# Patient Record
Sex: Female | Born: 1970
Health system: Southern US, Community
[De-identification: ages and names within clinical notes are randomized; demographics above are authoritative.]

## PROBLEM LIST (undated history)

## (undated) DIAGNOSIS — K589 Irritable bowel syndrome without diarrhea: Secondary | ICD-10-CM

## (undated) DIAGNOSIS — F419 Anxiety disorder, unspecified: Secondary | ICD-10-CM

## (undated) DIAGNOSIS — R51 Headache: Secondary | ICD-10-CM

## (undated) HISTORY — PX: ABLATION: SHX5711

## (undated) HISTORY — PX: CHOLECYSTECTOMY: SHX55

## (undated) HISTORY — PX: TUBAL LIGATION: SHX77

---

## 2004-01-28 ENCOUNTER — Ambulatory Visit (HOSPITAL_COMMUNITY): Admission: RE | Admit: 2004-01-28 | Discharge: 2004-01-28 | Payer: Self-pay | Admitting: Family Medicine

## 2006-11-03 ENCOUNTER — Ambulatory Visit: Payer: Self-pay | Admitting: Internal Medicine

## 2007-03-31 ENCOUNTER — Encounter (INDEPENDENT_AMBULATORY_CARE_PROVIDER_SITE_OTHER): Payer: Self-pay | Admitting: Obstetrics and Gynecology

## 2007-03-31 ENCOUNTER — Ambulatory Visit (HOSPITAL_COMMUNITY): Admission: RE | Admit: 2007-03-31 | Discharge: 2007-03-31 | Payer: Self-pay | Admitting: Obstetrics and Gynecology

## 2007-09-04 ENCOUNTER — Emergency Department (HOSPITAL_COMMUNITY): Admission: EM | Admit: 2007-09-04 | Discharge: 2007-09-04 | Payer: Self-pay | Admitting: Family Medicine

## 2007-12-02 ENCOUNTER — Emergency Department (HOSPITAL_COMMUNITY): Admission: EM | Admit: 2007-12-02 | Discharge: 2007-12-02 | Payer: Self-pay | Admitting: Emergency Medicine

## 2008-04-01 ENCOUNTER — Emergency Department (HOSPITAL_COMMUNITY): Admission: EM | Admit: 2008-04-01 | Discharge: 2008-04-01 | Payer: Self-pay | Admitting: Emergency Medicine

## 2008-07-16 ENCOUNTER — Ambulatory Visit (HOSPITAL_COMMUNITY): Admission: RE | Admit: 2008-07-16 | Discharge: 2008-07-16 | Payer: Self-pay | Admitting: Pediatrics

## 2008-12-19 ENCOUNTER — Emergency Department (HOSPITAL_COMMUNITY): Admission: EM | Admit: 2008-12-19 | Discharge: 2008-12-19 | Payer: Self-pay | Admitting: Family Medicine

## 2009-11-24 ENCOUNTER — Ambulatory Visit: Payer: Self-pay | Admitting: Internal Medicine

## 2009-12-08 ENCOUNTER — Ambulatory Visit (HOSPITAL_COMMUNITY): Admission: RE | Admit: 2009-12-08 | Discharge: 2009-12-08 | Payer: Self-pay | Admitting: Internal Medicine

## 2009-12-08 ENCOUNTER — Ambulatory Visit: Payer: Self-pay | Admitting: Internal Medicine

## 2010-01-13 ENCOUNTER — Ambulatory Visit: Payer: Self-pay | Admitting: Internal Medicine

## 2010-03-11 ENCOUNTER — Other Ambulatory Visit: Payer: Self-pay | Admitting: Obstetrics and Gynecology

## 2010-03-11 DIAGNOSIS — N6459 Other signs and symptoms in breast: Secondary | ICD-10-CM

## 2010-03-16 ENCOUNTER — Ambulatory Visit
Admission: RE | Admit: 2010-03-16 | Discharge: 2010-03-16 | Disposition: A | Discharge status: Home / Self Care | Payer: Commercial Managed Care - PPO | Source: Ambulatory Visit | Attending: Obstetrics and Gynecology | Admitting: Obstetrics and Gynecology

## 2010-03-16 ENCOUNTER — Other Ambulatory Visit: Payer: Self-pay | Admitting: Obstetrics and Gynecology

## 2010-03-16 DIAGNOSIS — N6459 Other signs and symptoms in breast: Secondary | ICD-10-CM

## 2010-04-30 LAB — POCT CARDIAC MARKERS
Myoglobin, poc: 51.1 ng/mL (ref 12–200)
Troponin i, poc: <0.05 ng/mL (ref 0.00–0.09)

## 2010-04-30 LAB — BASIC METABOLIC PANEL
BUN: 14 mg/dL (ref 6–23)
Calcium: 9.4 mg/dL (ref 8.4–10.5)
GFR calc non Af Amer: >60 mL/min (ref >60)
Glucose, Bld: 96 mg/dL (ref 70–99)
Potassium: 3.6 mEq/L (ref 3.5–5.1)

## 2010-04-30 LAB — DIFFERENTIAL
Basophils Absolute: 0.1 10*3/uL (ref 0.0–0.1)
Eosinophils Relative: 2 % (ref 0–5)
Lymphocytes Relative: 42 % (ref 12–46)

## 2010-04-30 LAB — CBC
HCT: 42.5 % (ref 36.0–46.0)
Platelets: 242 10*3/uL (ref 150–400)
RDW: 12.2 % (ref 11.5–15.5)

## 2010-04-30 LAB — D-DIMER, QUANTITATIVE: D-Dimer, Quant: 0.22 ug/mL-FEU (ref 0.00–0.48)

## 2010-06-02 NOTE — Assessment & Plan Note (Signed)
NAMEMarland Kitchen  Kathleen Price, Kathleen Price                 CHART#:  [id number]   DATE:                                   DOB:  1970/07/20   REQUESTING PHYSICIAN:  Dr. Milinda Cave.   REASON FOR CONSULTATION:  Chronic constipation.   HISTORY OF PRESENT ILLNESS:  The patient is a 40 year old Caucasian  female.  She has a longstanding history of constipation for which she  has had IBS for many years.  She tells me that generally her IBS  alternates between constipation and diarrhea.  However, over the last 3  months, she has had severe constipation.  She is having to take 2  Senokot S every night at bedtime for the last month.  She has also been  tried on Amitiza 8 mcg b.i.d. for a month which did not seem to help.  She complains of abdominal bloating, as well as cramp-like abdominal  pain which is usually relieved post defecation.  She denies any rectal  bleeding or melena.  She can go up to a week without a bowel movement.  She has had problems with her stools since age 47.  Her IBS presented as  diarrhea initially.  She does have some nausea, especially in the  evenings, had 1 episode of vomiting, but no problems with chronic  vomiting.  Denies any heartburn or indigestion.  Denies any dysphagia or  odynophagia.  Denies any anorexia.  She returned 3 Hemoccult cards  through Dr. Samul Dada office which were all negative.  Her weight is up  about 7 pounds in the last 3 months.  She is complaining of some night  sweats.   PAST MEDICAL/SURGICAL HISTORY:  She is status post cholecystectomy in  2005 secondary to cholelithiasis.  She is status post tubal ligation in  2001.  She has had 2 C-sections.   CURRENT MEDICATIONS:  1. Senokot S p.r.n.  2. Wellbutrin 150 mg b.i.d.   ALLERGIES:  NO KNOWN DRUG ALLERGIES.   FAMILY HISTORY:  Positive for maternal grandfather with colon cancer  diagnosed in his 36s.  Mother, age 36, has hyperlipidemia.  Father, age  3, is healthy.  She has 2 healthy brothers.   SOCIAL  HISTORY:  The patient is single.  She lives with her fiance' and  is to be married in May of 2009.  She has 3 healthy children.  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  She has a 20-pack year  history of tobacco use.  She consumes 1 to 2 alcoholic beverages a year.  She denies any drug use.   REVIEW OF SYSTEMS:  See HPI.  GYN:  Last menstrual period 09/19/06.  She  admits to being late but is status post tubal ligation in 2001.  Typically, she has normal 30-day cycles.   PHYSICAL EXAM:  VITAL SIGNS:  Weight 129 pounds.  Height 63 inches.  Temp 98.1.  Blood pressure 100/80.  Pulse 64.  GENERAL:  The patient is a 40 year old Caucasian female who is alert,  oriented, pleasant, cooperative, and in no acute distress.  HEENT:  Pupils equal.  Sclerae clear.  Nonicteric.  Oropharynx pink and  moist withoutlesions.  NECK:  Supple without any masses or thyromegaly.  She does have a single  right cervical chain node  which is freely mobile, slightly tender, and  approximately 1 x 1.5 cm, palpable.  CHEST:  Heart, regular rate and rhythm.  Normal S1 and S2 without  murmurs, clicks, rubs, or gallops.  LUNGS:  Clear to auscultation bilaterally.  ABDOMEN:  Positive bowel sounds x4.  No bruits auscultated.  Soft,  nontender, and nondistended.  She does have umbilical ornament.  There  is no rebound, tenderness, or guarding.  No hepatosplenomegaly or mass.  EXTREMITIES:  Without any clubbing or edema bilaterally.  SKIN:  Pink, warm, and dry without any rash or jaundice.  RECTAL:  No external lesionsvisualized.  Good sphincter tone.  No  internal masses palpated.  A small amount of light brown stool was  obtained from the vault which Hemoccult negative.   LABORATORY STUDIES:  From 09/21/06, show a normal CBC, a normal sed  rate, and normal comprehensive metabolic panel.   IMPRESSION:  The patient is a 40 year old Caucasian female with history  significant for IBS which generally  alternates between diarrhea and  constipation, however, over the last 3 months she has had significant  constipation that has not responded to low-dose Amitiza.  She has also  had weight gain and I feel it would be appropriate to rule out  hypothyroidism in this lady.  I doubt we are dealing with pelvic floor  dyssynergy given the fact that constipation is a new problem for the  patient.   PLAN:  1. Increase Amitiza to 24 mcg 1 p.o. daily or b.i.d. as needed for      constipation, #31, with 1 refill and I have given her 2 boxes of      samples.  2. Check TSH.  3. MiraLax 17 g daily if needed for constipation.  4. Begin fiber supplement of choice.  I have given her Benefiber      samples.  5. Constipation literature given for her review.  6. Office visit in 6 weeks with Dr. Jena Gauss to assess her progress.   I would like to thank Dr. Milinda Cave for allowing [place] to participate in the  care of the patient.       Lorenza Burton, N.P.  Electronically Signed     R. Roetta Sessions, M.D.  Electronically Signed    KJ/MEDQ  D:  11/03/2006  T:  11/04/2006  Job:  [id number]   cc:   Jeoffrey Massed, MD

## 2010-06-02 NOTE — Op Note (Signed)
Kathleen Price, Kathleen Price                ACCOUNT NO.:  [id number]   MEDICAL RECORD NO.:  [id number]          PATIENT TYPE:  AMB   LOCATION:  SDC                           FACILITY:  [facility]   PHYSICIAN:  Miguel Aschoff, M.D.       DATE OF BIRTH:  [birth year]   DATE OF PROCEDURE:  03/31/2007  DATE OF DISCHARGE:                               OPERATIVE REPORT   PREOPERATIVE DIAGNOSIS:  Menorrhagia.   POSTOPERATIVE DIAGNOSIS:  Menorrhagia.   PROCEDURE:  Cervical dilatation hysteroscopy, uterine curettage,  NovaSure endometrial ablation.   SURGEON:  Dr. Miguel Aschoff.   ANESTHESIA:  General.   COMPLICATIONS:  None.   JUSTIFICATION:  The patient is a 40 year old white female with history  of very heavy menses, getting  progressively worse.  She now requests a  procedure to be done in an effort to control her menstrual flow.  The  risks and benefits of endometrial ablation were discussed with the  patient, and informed consent has been obtained.   PROCEDURE:  The patient was taken to the operating room, placed in the  supine position, and general anesthesia was administered without  difficulty.  She was then placed in the dorsal lithotomy position,  prepped and draped in usual sterile fashion.  The bladder was  catheterized.  After this was done, examination under anesthesia was  carried out which revealed normal external genitalia, normal Bartholin  and Skene's glands, normal urethra.  The vaginal vault was without gross  lesion.  The cervix was without gross lesion.  The uterus was noted to  be mid position, top normal size.  There were no adnexal masses noted.  At this point, speculum was placed in the vaginal vault.  Anterior  cervical lip was grasped with a tenaculum, and then the endometrial  cavity was sounded to 10 cm.  Cervical length of 4 cm was then  determined.  At this point, serial Pratt dilators were used to dilate  the cervix until a #25 Pratt dilator could be passed.  At this  point,  the diagnostic hysteroscope was advanced through the endocervix.  No  endocervical lesions were noted.  The endometrial cavity was inspected.  There were no submucous myomas or polyps noted.  Cavity appeared smooth  and regular.  The scope was then removed.  A sharp curettage was then  carried out using a medium-size serrated curette, and then at completion  of the curettage, the NovaSure endometrial ablation unit was placed into  the uterus.  Cavity width was then determined of 3.7 cm, and with 122  watts of power, a 1-minute and 20-second treatment cycle was carried out  without difficulty.  At completion of the ablation, the NovaSure unit  was removed intact and inspected, and then the scope was replaced into  the uterine cavity and revealed good coagulation of the endometrial  surface.  At this point, the procedure was completed.  The cervix was  injected with 1% Xylocaine for analgesia.  All instruments were removed.  Excellent hemostasis was obtained.  Lap counts and instrument counts  were found to  be correct, and the procedure was completed.  The patient  was reversed from the anesthetic and brought to the recovery room.   PLAN:  Is for the patient be discharged home.  Medications for home  include doxycycline 100 mg twice a day for 3 days and Darvocet-N 100 one  every hours as needed for pain.  The patient will be seen back in 4  weeks for follow-up examination.  She is to call for any problems such  as fever, pain, or heavy bleeding.      Miguel Aschoff, M.D.  Electronically Signed     AR/MEDQ  D:  03/31/2007  T:  04/01/2007  Job:  [id number]

## 2010-10-12 LAB — CBC
HCT: 41.1
Hemoglobin: 14.6
MCHC: 35.6
MCV: 89.8
Platelets: 245
RDW: 12.4

## 2011-02-28 ENCOUNTER — Emergency Department (HOSPITAL_COMMUNITY): Payer: 59

## 2011-02-28 ENCOUNTER — Emergency Department (HOSPITAL_COMMUNITY)
Admission: EM | Admit: 2011-02-28 | Discharge: 2011-02-28 | Disposition: A | Discharge status: Home / Self Care | Payer: 59 | Attending: Emergency Medicine | Admitting: Emergency Medicine

## 2011-02-28 ENCOUNTER — Encounter (HOSPITAL_COMMUNITY): Payer: Self-pay

## 2011-02-28 DIAGNOSIS — M2669 Other specified disorders of temporomandibular joint: Secondary | ICD-10-CM | POA: Insufficient documentation

## 2011-02-28 DIAGNOSIS — R209 Unspecified disturbances of skin sensation: Secondary | ICD-10-CM | POA: Insufficient documentation

## 2011-02-28 DIAGNOSIS — F17200 Nicotine dependence, unspecified, uncomplicated: Secondary | ICD-10-CM | POA: Insufficient documentation

## 2011-02-28 DIAGNOSIS — R51 Headache: Secondary | ICD-10-CM | POA: Insufficient documentation

## 2011-02-28 HISTORY — DX: Headache: R51

## 2011-02-28 LAB — DIFFERENTIAL
Basophils Absolute: 0 10*3/uL (ref 0.0–0.1)
Basophils Relative: 0 % (ref 0–1)
Eosinophils Absolute: 0.2 10*3/uL (ref 0.0–0.7)
Eosinophils Relative: 2 % (ref 0–5)
Lymphs Abs: 1.9 10*3/uL (ref 0.7–4.0)
Neutrophils Relative %: 72 % (ref 43–77)

## 2011-02-28 LAB — CBC
MCH: 31.3 pg (ref 26.0–34.0)
MCHC: 34.9 g/dL (ref 30.0–36.0)
MCV: 89.7 fL (ref 78.0–100.0)
Platelets: 229 10*3/uL (ref 150–400)
RDW: 13.2 % (ref 11.5–15.5)

## 2011-02-28 LAB — BASIC METABOLIC PANEL
Calcium: 9.2 mg/dL (ref 8.4–10.5)
GFR calc Af Amer: >90 mL/min (ref >90)
GFR calc non Af Amer: >90 mL/min (ref >90)
Glucose, Bld: 148 mg/dL — ABNORMAL HIGH (ref 70–99)
Sodium: 139 mEq/L (ref 135–145)

## 2011-02-28 MED ORDER — FROVATRIPTAN SUCCINATE 2.5 MG PO TABS: 2.5000 mg | ORAL_TABLET | ORAL | Status: DC | PRN

## 2011-02-28 MED ORDER — TRAMADOL HCL 50 MG PO TABS
50.0000 mg | ORAL_TABLET | Freq: Once | ORAL | Status: AC
  Administered 2011-02-28: 50 mg via ORAL
  Filled 2011-02-28: qty 1

## 2011-02-28 MED ORDER — METOCLOPRAMIDE HCL 10 MG PO TABS
10.0000 mg | ORAL_TABLET | Freq: Once | ORAL | Status: AC
  Administered 2011-02-28: 10 mg via ORAL
  Filled 2011-02-28: qty 1

## 2011-02-28 MED ORDER — SUMATRIPTAN SUCCINATE 100 MG PO TABS: 100.0000 mg | ORAL_TABLET | ORAL | Status: DC | PRN

## 2011-02-28 NOTE — ED Notes (Signed)
Pt c/o headache for 2 days, right side facial numbness that started today, right eye pain and right ear pain.   H/o headache and facial umbness.  Denies any n/v or fever.  No dizziness.

## 2011-02-28 NOTE — ED Provider Notes (Signed)
History  This chart was scribed for Kathleen Racer, MD by Bennett Scrape. This patient was seen in room APA05/APA05 and the patient's care was started at 5:12PM.  CSN: [id number]  Arrival date & time 02/28/11  1417   First MD Initiated Contact with Patient 02/28/11 1649      Chief Complaint  Patient presents with  . Headache  . Numbness    Patient is a 41 y.o. female presenting with headaches. The history is provided by the patient. No language interpreter was used.  Headache  This is a new problem. The current episode started yesterday. The problem occurs constantly. The problem has not changed since onset.The headache is associated with nothing. The pain is located in the parietal region. The quality of the pain is described as dull. The pain does not radiate. Pertinent negatives include no fever, no shortness of breath, no nausea and no vomiting.    Kathleen Price is a 41 y.o. female who presents to the Emergency Department complaining of less than 24 hours of a gradual onset, non-changing, constant HA located along the left parietal described as dull with associated left sided facial numbness around the left eye, vision impairment in her left eye described as a "bouncing: sensation and intermittent ringing in the left ear that started a couple of hours before the pt went to bed last night. She also c/o increased episodes of pain described as a pins and needles feeling in her left arm since th onset of the HA. She reports taking Motrin with no improvement in symptoms. She denies any modifying factors. She reports previous episodes of milder facial numbness and HA that resolved on their own. She states that she has seen her doctor for the symptoms and was diagnosed with cluster HAs. She denies visual or hearing changes, dizziness, congestion, nausea, vomiting and diarrhea as associated symptoms. She has no h/o other chronic medical conditions. She is a current everyday smoker but denies  alcohol use.  Past Medical History  Diagnosis Date  . Headache     Past Surgical History  Procedure Date  . Cesarean section   . Cholecystectomy   . Tubal ligation     No family history on file.  History  Substance Use Topics  . Smoking status: Current Everyday Smoker -- 0.5 packs/day  . Smokeless tobacco: Not on file  . Alcohol Use: No     Review of Systems  Constitutional: Negative for fever and chills.  HENT: Negative for congestion, sore throat, rhinorrhea and neck pain.   Eyes: Negative for pain.  Respiratory: Negative for cough and shortness of breath.   Cardiovascular: Negative for chest pain.  Gastrointestinal: Negative for nausea, vomiting, abdominal pain and diarrhea.  Genitourinary: Negative for dysuria, frequency, hematuria and flank pain.  Musculoskeletal: Negative for back pain.  Skin: Negative for rash.  Neurological: Positive for numbness and headaches. Negative for dizziness and light-headedness.    Allergies  Wellbutrin  Home Medications   Current Outpatient Rx  Name Route Sig Dispense Refill  . SUMATRIPTAN SUCCINATE 100 MG PO TABS Oral Take 1 tablet (100 mg total) by mouth every 2 (two) hours as needed for migraine. 10 tablet 0    Triage Vitals: BP 112/58  Pulse 78  Temp(Src) 97.4 F (36.3 C) (Oral)  Resp 18  Ht 5\' 3"  (1.6 m)  Wt 125 lb (56.7 kg)  BMI 22.14 kg/m2  SpO2 100%  Physical Exam  Nursing note and vitals reviewed. Constitutional: She is oriented to person,  place, and time. She appears well-developed and well-nourished. No distress.  HENT:  Head: Normocephalic and atraumatic.  Mouth/Throat: Oropharynx is clear and moist.       Effusion of the left TM, no erythema   Eyes: EOM are normal. Pupils are equal, round, and reactive to light.       No nystagmus   Neck: Normal range of motion. Neck supple. No tracheal deviation present.       No meningeal signs  Cardiovascular: Normal rate, regular rhythm and normal heart sounds.     Pulmonary/Chest: Effort normal and breath sounds normal. No stridor. No respiratory distress. She has no wheezes. She has no rales. She exhibits no tenderness.  Abdominal: Soft. Bowel sounds are normal. She exhibits no distension. There is no tenderness. There is no rebound and no guarding.  Musculoskeletal: Normal range of motion. She exhibits no edema and no tenderness.  Neurological: She is alert and oriented to person, place, and time. No cranial nerve deficit. Coordination normal.       5 out of 5 motor strength in all extremities, mild sensation decrease around the left eye  Skin: Skin is warm and dry. No rash noted.  Psychiatric: She has a normal mood and affect. Her behavior is normal.    ED Course  Procedures (including critical care time)  DIAGNOSTIC STUDIES: Oxygen Saturation is 100% on room air, normal by my interpretation.    COORDINATION OF CARE: 5:19Pm-Discussed CT scan of head and treatment plan with pt at bedside and pt agreed. I will contact neurologist to discuss possible appointment for pt. Consult-Call started at 8:09PM. Discussed pt's symptoms with Dr. Cleophus Molt, neurologist. Call ended at 8:12PM.  Labs Reviewed  BASIC METABOLIC PANEL - Abnormal; Notable for the following:    Potassium 3.4 (*)    Glucose, Bld 148 (*)    All other components within normal limits  CBC  DIFFERENTIAL   Ct Head Wo Contrast  02/28/2011  *RADIOLOGY REPORT*  Clinical Data: Left-sided headache  CT HEAD WITHOUT CONTRAST  Technique:  Contiguous axial images were obtained from the base of the skull through the vertex without contrast.  Comparison: None.  Findings: No skull fracture is noted.  Paranasal sinuses and mastoid air cells are unremarkable.  No intracranial hemorrhage, mass effect or midline shift.  No hydrocephalus.  The gray and white matter differentiation is preserved.  No intra or extra-axial fluid collection.  There is no acute infarction.  No mass lesion is noted on this unenhanced  scan.  IMPRESSION: No acute intracranial abnormality.  Original Report Authenticated By: Natasha Mead, M.D.     1. HA (headache)       MDM  Dr Cleophus Molt eval'd pt. OK to d/c home and f/u. Suggest imetrex for pt symptoms. HA likely cluster vs migraine. Pt currently asymptomatic. VSS      I personally performed the services described in this documentation, which was scribed in my presence. The recorded information has been reviewed and considered.        Kathleen Racer, MD 02/28/11 2109

## 2011-02-28 NOTE — ED Notes (Signed)
Pt presents with headache and facial numbness since last night. Pt also reports she is experiencing "bouncing in my eyes".

## 2011-04-09 ENCOUNTER — Other Ambulatory Visit: Payer: Self-pay | Admitting: Obstetrics and Gynecology

## 2011-04-09 DIAGNOSIS — Z1231 Encounter for screening mammogram for malignant neoplasm of breast: Secondary | ICD-10-CM

## 2011-05-03 ENCOUNTER — Ambulatory Visit: Payer: Commercial Managed Care - PPO

## 2011-05-17 ENCOUNTER — Ambulatory Visit
Admission: RE | Admit: 2011-05-17 | Discharge: 2011-05-17 | Disposition: A | Discharge status: Home / Self Care | Payer: 59 | Source: Ambulatory Visit | Attending: Obstetrics and Gynecology | Admitting: Obstetrics and Gynecology

## 2011-05-17 DIAGNOSIS — Z1231 Encounter for screening mammogram for malignant neoplasm of breast: Secondary | ICD-10-CM

## 2012-04-11 ENCOUNTER — Other Ambulatory Visit: Payer: Self-pay

## 2012-04-11 DIAGNOSIS — Z1231 Encounter for screening mammogram for malignant neoplasm of breast: Secondary | ICD-10-CM

## 2012-05-12 ENCOUNTER — Other Ambulatory Visit: Payer: Self-pay | Admitting: Obstetrics and Gynecology

## 2012-05-19 ENCOUNTER — Ambulatory Visit
Admission: RE | Admit: 2012-05-19 | Discharge: 2012-05-19 | Disposition: A | Discharge status: Home / Self Care | Payer: 59 | Source: Ambulatory Visit

## 2012-05-19 DIAGNOSIS — Z1231 Encounter for screening mammogram for malignant neoplasm of breast: Secondary | ICD-10-CM

## 2012-07-02 ENCOUNTER — Emergency Department (HOSPITAL_COMMUNITY)
Admission: EM | Admit: 2012-07-02 | Discharge: 2012-07-02 | Disposition: A | Discharge status: Home / Self Care | Payer: 59 | Attending: Emergency Medicine | Admitting: Emergency Medicine

## 2012-07-02 ENCOUNTER — Emergency Department (HOSPITAL_COMMUNITY): Payer: 59

## 2012-07-02 DIAGNOSIS — M1990 Unspecified osteoarthritis, unspecified site: Secondary | ICD-10-CM | POA: Insufficient documentation

## 2012-07-02 DIAGNOSIS — M47812 Spondylosis without myelopathy or radiculopathy, cervical region: Secondary | ICD-10-CM

## 2012-07-02 DIAGNOSIS — M436 Torticollis: Secondary | ICD-10-CM

## 2012-07-02 DIAGNOSIS — X500XXA Overexertion from strenuous movement or load, initial encounter: Secondary | ICD-10-CM | POA: Insufficient documentation

## 2012-07-02 DIAGNOSIS — R51 Headache: Secondary | ICD-10-CM | POA: Insufficient documentation

## 2012-07-02 DIAGNOSIS — Y9389 Activity, other specified: Secondary | ICD-10-CM | POA: Insufficient documentation

## 2012-07-02 DIAGNOSIS — Y929 Unspecified place or not applicable: Secondary | ICD-10-CM | POA: Insufficient documentation

## 2012-07-02 DIAGNOSIS — F17200 Nicotine dependence, unspecified, uncomplicated: Secondary | ICD-10-CM | POA: Insufficient documentation

## 2012-07-02 MED ORDER — HYDROCODONE-ACETAMINOPHEN 7.5-325 MG PO TABS: 1.0000 | ORAL_TABLET | ORAL | Status: DC | PRN

## 2012-07-02 MED ORDER — DIAZEPAM 5 MG PO TABS: ORAL_TABLET | ORAL | Status: DC

## 2012-07-02 MED ORDER — HYDROCODONE-ACETAMINOPHEN 5-325 MG PO TABS
2.0000 | ORAL_TABLET | Freq: Once | ORAL | Status: AC
  Administered 2012-07-02: 2 via ORAL
  Filled 2012-07-02: qty 2

## 2012-07-02 MED ORDER — ONDANSETRON HCL 4 MG PO TABS
4.0000 mg | ORAL_TABLET | Freq: Once | ORAL | Status: AC
  Administered 2012-07-02: 4 mg via ORAL
  Filled 2012-07-02: qty 1

## 2012-07-02 MED ORDER — MELOXICAM 7.5 MG PO TABS: ORAL_TABLET | ORAL | Status: DC

## 2012-07-02 MED ORDER — DIAZEPAM 5 MG PO TABS
5.0000 mg | ORAL_TABLET | Freq: Once | ORAL | Status: AC
  Administered 2012-07-02: 5 mg via ORAL
  Filled 2012-07-02: qty 1

## 2012-07-02 MED ORDER — KETOROLAC TROMETHAMINE 10 MG PO TABS
10.0000 mg | ORAL_TABLET | Freq: Once | ORAL | Status: AC
  Administered 2012-07-02: 10 mg via ORAL
  Filled 2012-07-02: qty 1

## 2012-07-02 NOTE — ED Notes (Signed)
States she was making up a bed on Friday and started having neck pain that radiates to her elbow.

## 2012-07-02 NOTE — ED Provider Notes (Signed)
History     CSN: [id number]  Arrival date & time 07/02/12  1432   First MD Initiated Contact with Patient 07/02/12 1455      Chief Complaint  Patient presents with  . Neck Injury    (Consider location/radiation/quality/duration/timing/severity/associated sxs/prior treatment) HPI Comments: Pt c/o pain and burning from the neck to the left elbow. This pain is worse with movement .  Patient is a 42 y.o. female presenting with neck injury. The history is provided by the patient.  Neck Injury This is a new problem. The current episode started in the past 7 days. The problem occurs constantly. The problem has been gradually worsening. Associated symptoms include headaches. Pertinent negatives include no abdominal pain, arthralgias, chest pain, coughing or neck pain. Exacerbated by: turning the neck.  She has tried NSAIDs for the symptoms. The treatment provided no relief.    Past Medical History  Diagnosis Date  . Headache     Past Surgical History  Procedure Laterality Date  . Cesarean section    . Cholecystectomy    . Tubal ligation      No family history on file.  History  Substance Use Topics  . Smoking status: Current Every Day Smoker -- 0.50 packs/day  . Smokeless tobacco: Not on file  . Alcohol Use: No    OB History   Grav Para Term Preterm Abortions TAB SAB Ect Mult Living                  Review of Systems  Constitutional: Negative for activity change.       All ROS Neg except as noted in HPI  HENT: Negative for nosebleeds and neck pain.   Eyes: Negative for photophobia and discharge.  Respiratory: Negative for cough, shortness of breath and wheezing.   Cardiovascular: Negative for chest pain and palpitations.  Gastrointestinal: Negative for abdominal pain and blood in stool.  Genitourinary: Negative for dysuria, frequency and hematuria.  Musculoskeletal: Negative for back pain and arthralgias.  Skin: Negative.   Neurological: Positive for headaches.  Negative for dizziness, seizures and speech difficulty.  Psychiatric/Behavioral: Negative for hallucinations and confusion.    Allergies  Wellbutrin  Home Medications   Current Outpatient Rx  Name  Route  Sig  Dispense  Refill  . ibuprofen (ADVIL,MOTRIN) 200 MG tablet   Oral   Take 800 mg by mouth every 6 (six) hours as needed for pain.           BP 117/74  Pulse 90  Temp(Src) 98.6 F (37 C) (Oral)  Resp 16  Ht 5\' 3"  (1.6 m)  Wt 135 lb (61.236 kg)  BMI 23.92 kg/m2  SpO2 100%  Physical Exam  Nursing note and vitals reviewed. Constitutional: She is oriented to person, place, and time. She appears well-developed and well-nourished.  Non-toxic appearance.  HENT:  Head: Normocephalic.  Right Ear: Tympanic membrane and external ear normal.  Left Ear: Tympanic membrane and external ear normal.  Eyes: EOM and lids are normal. Pupils are equal, round, and reactive to light.  Neck: Carotid bruit is not present. Decreased range of motion present.    Cardiovascular: Normal rate, regular rhythm, normal heart sounds, intact distal pulses and normal pulses.   Pulmonary/Chest: Breath sounds normal. No respiratory distress.  Abdominal: Soft. Bowel sounds are normal. There is no tenderness. There is no guarding.  Lymphadenopathy:       Head (right side): No submandibular adenopathy present.       Head (left  side): No submandibular adenopathy present.    She has no cervical adenopathy.  Neurological: She is alert and oriented to person, place, and time. She has normal strength. No cranial nerve deficit or sensory deficit.  Skin: Skin is warm and dry.  Psychiatric: She has a normal mood and affect. Her speech is normal.    ED Course  Procedures (including critical care time)  Labs Reviewed - No data to display No results found.   No diagnosis found.    MDM  *I have reviewed nursing notes, vital signs, and all appropriate lab and imaging results for this patient.** Pt  injured the left neck while making up the bed. No problem with grip, or ROM. Ct of the c-spine reveals  negative  For fx.or dislocation. Rx for valium, norco 7.5, and mobic given. Pt to follow up with Dr Rennis Chris if not improving.       Kathie Dike, PA-C 07/02/12 2220

## 2012-07-04 ENCOUNTER — Encounter (HOSPITAL_COMMUNITY): Payer: Self-pay | Admitting: Emergency Medicine

## 2012-07-04 NOTE — ED Provider Notes (Signed)
Medical screening examination/treatment/procedure(s) were performed by non-physician practitioner and as supervising physician I was immediately available for consultation/collaboration.   Luceal Hollibaugh W Kermit Arnette, MD 07/04/12 1514 

## 2013-03-19 ENCOUNTER — Emergency Department (INDEPENDENT_AMBULATORY_CARE_PROVIDER_SITE_OTHER): Payer: 59

## 2013-03-19 ENCOUNTER — Encounter (HOSPITAL_COMMUNITY): Payer: Self-pay | Admitting: Emergency Medicine

## 2013-03-19 ENCOUNTER — Emergency Department (HOSPITAL_COMMUNITY)
Admission: EM | Admit: 2013-03-19 | Discharge: 2013-03-19 | Disposition: A | Discharge status: Home / Self Care | Payer: 59 | Source: Home / Self Care

## 2013-03-19 DIAGNOSIS — R0602 Shortness of breath: Secondary | ICD-10-CM

## 2013-03-19 NOTE — ED Notes (Addendum)
Reportedly has been having trouble w breathing since 2-27. States has not used any OTC medications for her problem. Chest clear to ascultation , NAD. Speaking in clear , complete sentences. Talking on phone w/o observable dyspnea on RN entry to exam room

## 2013-03-19 NOTE — ED Notes (Signed)
Called by Tammi Klippel (reg. Assoc) to assess this pt. She c/o cough, congestion, and shortness of breath, achy feeling, and fatigue x 3 days. Pt speaks in full multiple word sentences w/o taking a breath. Pt instructed to wait in the waiting area, and to inform any staff member if anything changed in her current condition/complaint prior to being called back to the treatment/exam area.Pt verbalized her understanding of instruction.

## 2013-03-19 NOTE — Discharge Instructions (Signed)
See your doctor as planned, no more smoking.

## 2013-03-19 NOTE — ED Notes (Signed)
Discussed to stop smoking, and to follow up with her MD as needed

## 2013-03-19 NOTE — ED Provider Notes (Signed)
CSN: [id number]     Arrival date & time 03/19/13  1525 History   None    Chief Complaint  Patient presents with  . Shortness of Breath   (Consider location/radiation/quality/duration/timing/severity/associated sxs/prior Treatment) Patient is a 43 y.o. female presenting with shortness of breath. The history is provided by the patient and the spouse.  Shortness of Breath Severity:  Mild Onset quality:  Gradual Duration:  1 month Timing:  Constant Progression:  Unchanged Chronicity:  New Context: smoke exposure   Associated symptoms: no abdominal pain, no chest pain, no cough, no fever, no neck pain, no PND and no wheezing   Associated symptoms comment:  Husband relates stress, not sleeping well, h/o depression.pt just don't feel well. Risk factors: tobacco use     Past Medical History  Diagnosis Date  . JJOACZYS(063.0)    Past Surgical History  Procedure Laterality Date  . Cesarean section    . Cholecystectomy    . Tubal ligation     History reviewed. No pertinent family history. History  Substance Use Topics  . Smoking status: Current Every Day Smoker -- 0.50 packs/day  . Smokeless tobacco: Not on file  . Alcohol Use: No   OB History   Grav Para Term Preterm Abortions TAB SAB Ect Mult Living                 Review of Systems  Constitutional: Negative.  Negative for fever.  Respiratory: Positive for shortness of breath. Negative for cough and wheezing.   Cardiovascular: Negative for chest pain, leg swelling and PND.  Gastrointestinal: Negative for abdominal pain.  Musculoskeletal: Negative for neck pain.    Allergies  Wellbutrin  Home Medications   Current Outpatient Rx  Name  Route  Sig  Dispense  Refill  . diazepam (VALIUM) 5 MG tablet      1 po tid for spasm   21 tablet   0   . HYDROcodone-acetaminophen (NORCO) 7.5-325 MG per tablet   Oral   Take 1 tablet by mouth every 4 (four) hours as needed for pain.   20 tablet   0   . ibuprofen  (ADVIL,MOTRIN) 200 MG tablet   Oral   Take 800 mg by mouth every 6 (six) hours as needed for pain.         . meloxicam (MOBIC) 7.5 MG tablet      1 po bid with food   12 tablet   0    BP 121/76  Pulse 75  Temp(Src) 98.9 F (37.2 C) (Oral)  Resp 16  SpO2 100% Physical Exam  Nursing note and vitals reviewed. Constitutional: She is oriented to person, place, and time. She appears well-developed and well-nourished.  HENT:  Head: Normocephalic.  Right Ear: External ear normal.  Left Ear: External ear normal.  Mouth/Throat: Oropharynx is clear and moist.  Eyes: Pupils are equal, round, and reactive to light.  Neck: Normal range of motion. Neck supple.  Cardiovascular: Normal rate, regular rhythm, normal heart sounds and intact distal pulses.   Pulmonary/Chest: Effort normal and breath sounds normal. No respiratory distress.  Neurological: She is alert and oriented to person, place, and time.  Skin: Skin is warm and dry.    ED Course  Procedures (including critical care time) Labs Review Labs Reviewed - No data to display Imaging Review Dg Chest 2 View  03/19/2013   CLINICAL DATA:  Shortness of breath on off for 2 months.  EXAM: CHEST  2 VIEW  COMPARISON:  12/19/2008  FINDINGS: The heart size and mediastinal contours are within normal limits. Both lungs are clear. The visualized skeletal structures are unremarkable.  IMPRESSION: No active cardiopulmonary disease.   Electronically Signed   By: Marin Olp M.D.   On: 03/19/2013 17:30   X-rays reviewed and report per radiologist.   MDM   1. SOB (shortness of breath)        Billy Fischer, MD 03/19/13 [phone]

## 2013-06-15 ENCOUNTER — Other Ambulatory Visit: Payer: Self-pay | Admitting: Obstetrics and Gynecology

## 2013-10-11 ENCOUNTER — Other Ambulatory Visit: Payer: Self-pay | Admitting: Occupational Medicine

## 2013-10-11 ENCOUNTER — Ambulatory Visit: Payer: Self-pay

## 2013-10-11 DIAGNOSIS — R52 Pain, unspecified: Secondary | ICD-10-CM

## 2015-01-31 DIAGNOSIS — E782 Mixed hyperlipidemia: Secondary | ICD-10-CM | POA: Diagnosis not present

## 2015-02-10 DIAGNOSIS — F339 Major depressive disorder, recurrent, unspecified: Secondary | ICD-10-CM | POA: Diagnosis not present

## 2015-02-10 DIAGNOSIS — J019 Acute sinusitis, unspecified: Secondary | ICD-10-CM | POA: Diagnosis not present

## 2015-02-10 DIAGNOSIS — E782 Mixed hyperlipidemia: Secondary | ICD-10-CM | POA: Diagnosis not present

## 2015-02-10 DIAGNOSIS — B351 Tinea unguium: Secondary | ICD-10-CM | POA: Diagnosis not present

## 2015-02-10 MED FILL — DULoxetine HCL 60 MG CPEP: 60 | 90 days supply | Qty: 90 | Fill #0

## 2015-02-10 MED FILL — AMOX TR-K CLV 875-125 MG TA: 875-125 | 10 days supply | Qty: 20 | Fill #0

## 2015-04-01 DIAGNOSIS — M542 Cervicalgia: Secondary | ICD-10-CM | POA: Diagnosis not present

## 2015-04-12 ENCOUNTER — Emergency Department (HOSPITAL_COMMUNITY)
Admission: EM | Admit: 2015-04-12 | Discharge: 2015-04-12 | Disposition: A | Discharge status: Home / Self Care | Payer: 59 | Attending: Emergency Medicine | Admitting: Emergency Medicine

## 2015-04-12 ENCOUNTER — Emergency Department (HOSPITAL_COMMUNITY): Payer: 59

## 2015-04-12 ENCOUNTER — Encounter (HOSPITAL_COMMUNITY): Payer: Self-pay | Admitting: Emergency Medicine

## 2015-04-12 DIAGNOSIS — R1031 Right lower quadrant pain: Secondary | ICD-10-CM | POA: Diagnosis not present

## 2015-04-12 DIAGNOSIS — Z794 Long term (current) use of insulin: Secondary | ICD-10-CM | POA: Diagnosis not present

## 2015-04-12 DIAGNOSIS — R110 Nausea: Secondary | ICD-10-CM | POA: Diagnosis not present

## 2015-04-12 DIAGNOSIS — R1013 Epigastric pain: Secondary | ICD-10-CM | POA: Diagnosis not present

## 2015-04-12 DIAGNOSIS — F17210 Nicotine dependence, cigarettes, uncomplicated: Secondary | ICD-10-CM | POA: Insufficient documentation

## 2015-04-12 DIAGNOSIS — R109 Unspecified abdominal pain: Secondary | ICD-10-CM | POA: Diagnosis not present

## 2015-04-12 DIAGNOSIS — K5000 Crohn's disease of small intestine without complications: Secondary | ICD-10-CM | POA: Insufficient documentation

## 2015-04-12 DIAGNOSIS — Z79899 Other long term (current) drug therapy: Secondary | ICD-10-CM | POA: Diagnosis not present

## 2015-04-12 DIAGNOSIS — R1030 Lower abdominal pain, unspecified: Secondary | ICD-10-CM

## 2015-04-12 HISTORY — DX: Irritable bowel syndrome, unspecified: K58.9

## 2015-04-12 LAB — CBC
HEMATOCRIT: 39.9 % (ref 36.0–46.0)
Hemoglobin: 13.7 g/dL (ref 12.0–15.0)
MCH: 30.7 pg (ref 26.0–34.0)
MCHC: 34.3 g/dL (ref 30.0–36.0)
MCV: 89.5 fL (ref 78.0–100.0)
PLATELETS: 278 10*3/uL (ref 150–400)
RBC: 4.46 MIL/uL (ref 3.87–5.11)
RDW: 12.9 % (ref 11.5–15.5)
WBC: 11.6 10*3/uL — AB (ref 4.0–10.5)

## 2015-04-12 LAB — COMPREHENSIVE METABOLIC PANEL
ALBUMIN: 4.2 g/dL (ref 3.5–5.0)
ALT: 19 U/L (ref 14–54)
AST: 20 U/L (ref 15–41)
Alkaline Phosphatase: 52 U/L (ref 38–126)
Anion gap: 7 (ref 5–15)
BUN: 16 mg/dL (ref 6–20)
CHLORIDE: 103 mmol/L (ref 101–111)
CO2: 27 mmol/L (ref 22–32)
CREATININE: 0.64 mg/dL (ref 0.44–1.00)
Calcium: 8.8 mg/dL — ABNORMAL LOW (ref 8.9–10.3)
GFR calc Af Amer: >60 mL/min (ref >60)
GLUCOSE: 96 mg/dL (ref 65–99)
POTASSIUM: 3.6 mmol/L (ref 3.5–5.1)
SODIUM: 137 mmol/L (ref 135–145)
Total Bilirubin: 0.4 mg/dL (ref 0.3–1.2)
Total Protein: 7.1 g/dL (ref 6.5–8.1)

## 2015-04-12 LAB — URINALYSIS, ROUTINE W REFLEX MICROSCOPIC
Bilirubin Urine: NEGATIVE
GLUCOSE, UA: NEGATIVE mg/dL
HGB URINE DIPSTICK: NEGATIVE
KETONES UR: NEGATIVE mg/dL
LEUKOCYTES UA: NEGATIVE
Nitrite: NEGATIVE
PH: 6 (ref 5.0–8.0)
PROTEIN: NEGATIVE mg/dL
Specific Gravity, Urine: 1.01 (ref 1.005–1.030)

## 2015-04-12 LAB — PREGNANCY, URINE: Preg Test, Ur: NEGATIVE

## 2015-04-12 LAB — LIPASE, BLOOD: LIPASE: 41 U/L (ref 11–51)

## 2015-04-12 MED ORDER — ONDANSETRON 8 MG PO TBDP: 8.0000 mg | ORAL_TABLET | Freq: Three times a day (TID) | ORAL | Status: DC | PRN

## 2015-04-12 MED ORDER — DIATRIZOATE MEGLUMINE & SODIUM 66-10 % PO SOLN
ORAL | Status: AC
  Administered 2015-04-12: 30 mL
  Filled 2015-04-12: qty 30

## 2015-04-12 MED ORDER — HYDROCODONE-ACETAMINOPHEN 5-325 MG PO TABS: 1.0000 | ORAL_TABLET | ORAL | Status: DC | PRN

## 2015-04-12 MED ORDER — IOHEXOL 300 MG/ML  SOLN
100.0000 mL | Freq: Once | INTRAMUSCULAR | Status: AC | PRN
  Administered 2015-04-12: 100 mL via INTRAVENOUS

## 2015-04-12 NOTE — Discharge Instructions (Signed)
Crohn Disease Crohn disease is a long-lasting (chronic) disease that affects your gastrointestinal (GI) tract. It often causes irritation and swelling (inflammation) in your small intestine and the beginning of your large intestine. However, it can affect any part of your GI tract. Crohn disease is part of a group of illnesses that are known as inflammatory bowel disease (IBD). Crohn disease may start slowly and get worse over time. Symptoms may come and go. They may also disappear for months or even years at a time (remission). CAUSES The exact cause of Crohn disease is not known. It may be a response that causes your body's defense system (immune system) to mistakenly attack healthy cells and tissues (autoimmune response). Your genes and your environment may also play a role. RISK FACTORS You may be at greater risk for Crohn disease if you:  Have other family members with Crohn disease or another IBD.  Use any tobacco products, including cigarettes, chewing tobacco, or electronic cigarettes.  Are in your 48s.  Have [place] European ancestry. SIGNS AND SYMPTOMS The main signs and symptoms of Crohn disease involve your GI tract. These include:  Diarrhea.  Rectal bleeding.  An urgent need to move your bowels.  The feeling that you are not finished having a bowel movement.  Abdominal pain or cramping.  Constipation. General signs and symptoms of Crohn disease may also include:  Unexplained weight loss.  Fatigue.  Fever.  Nausea.  Loss of appetite.  Joint pain  Changes in vision.  Red bumps on your skin. DIAGNOSIS Your health care provider may suspect Crohn disease based on your symptoms and your medical history. Your health care provider will do a physical exam. You may need to see a health care provider who specializes in diseases of the digestive tract (gastroenterologist). You may also have tests to help your health care providers make a diagnosis. These may  include:  Blood tests.  Stool sample tests.  Imaging tests, such as X-rays and CT scans.  Tests to examine the inside of your intestines using a long, flexible tube that has a light and a camera on the end (endoscopy or colonoscopy).  A procedure to take tissue samples from inside your bowel (biopsy) to be examined under a microscope. TREATMENT  There is no cure for Crohn disease. Treatment will focus on managing your symptoms. Crohn disease affects each person differently. Your treatment may include:  Resting your bowels. Drinking only clear liquids or getting nutrition through an IV for a period of time gives your bowels a chance to heal because they are not passing stools.  Medicines. These may be used alone or in combination (combination therapy). These may include antibiotic medicines. You may be given medicines that help to:  Reduce inflammation.  Control your immune system activity.  Fight infections.  Relieve cramps and prevent diarrhea.  Control your pain.  Surgery. You may need surgery if:  Medicines and other treatments are no longer working.  You develop complications from severe Crohn disease.  A section of your intestine becomes so damaged that it needs to be removed. HOME CARE INSTRUCTIONS  Take medicines only as directed by your health care provider.  If you were prescribed an antibiotic medicine, finish it all even if you start to feel better.  Keep all follow-up visits as directed by your health care provider. This is important.  Talk with your health care provider about changing your diet. This may help your symptoms. Your health care provide may recommend changes, such  as:  Drinking more fluids.  Avoiding milk and other foods that contain lactose.  Eating a low-fat diet.  Avoiding high-fiber foods, such as popcorn and nuts.  Avoiding carbonated beverages, such as soda.  Eating smaller meals more often rather than eating large  meals.  Keeping a food diary to identify foods that make your symptoms better or worse.  Do not use any tobacco products, including cigarettes, chewing tobacco, or electronic cigarettes. If you need help quitting, ask your health care provider.  Limit alcohol intake to no more than 1 drink per day for nonpregnant women and 2 drinks per day for men. One drink equals 12 ounces of beer, 5 ounces of wine, or 1 ounces of hard liquor.  Exercise daily or as directed by your health care provider. SEEK MEDICAL CARE IF:  You have diarrhea, abdominal cramps, and other gastrointestinal problems that are present almost all of the time.  Your symptoms do not improve with treatment.  You continue to lose weight.  You develop a rash or sores on your skin.  You develop eye problems.  You have a fever.   Your symptoms get worse.  You develop new symptoms. SEEK IMMEDIATE MEDICAL CARE IF:  You have bloody diarrhea.  You develop severe abdominal pain.  You cannot pass stools.   This information is not intended to replace advice given to you by your health care provider. Make sure you discuss any questions you have with your health care provider.   Document Released: 10/14/2004 Document Revised: 01/25/2014 Document Reviewed: 08/22/2013 Elsevier Interactive Patient Education 2016 Elsevier Inc[employer]  Abdominal Pain, Adult Many things can cause abdominal pain. Usually, abdominal pain is not caused by a disease and will improve without treatment. It can often be observed and treated at home. Your health care provider will do a physical exam and possibly order blood tests and X-rays to help determine the seriousness of your pain. However, in many cases, more time must pass before a clear cause of the pain can be found. Before that point, your health care provider may not know if you need more testing or further treatment. HOME CARE INSTRUCTIONS Monitor your abdominal pain for any changes. The following  actions may help to alleviate any discomfort you are experiencing:  Only take over-the-counter or prescription medicines as directed by your health care provider.  Do not take laxatives unless directed to do so by your health care provider.  Try a clear liquid diet (broth, tea, or water) as directed by your health care provider. Slowly move to a bland diet as tolerated. SEEK MEDICAL CARE IF:  You have unexplained abdominal pain.  You have abdominal pain associated with nausea or diarrhea.  You have pain when you urinate or have a bowel movement.  You experience abdominal pain that wakes you in the night.  You have abdominal pain that is worsened or improved by eating food.  You have abdominal pain that is worsened with eating fatty foods.  You have a fever. SEEK IMMEDIATE MEDICAL CARE IF:  Your pain does not go away within 2 hours.  You keep throwing up (vomiting).  Your pain is felt only in portions of the abdomen, such as the right side or the left lower portion of the abdomen.  You pass bloody or black tarry stools. MAKE SURE YOU:  Understand these instructions.  Will watch your condition.  Will get help right away if you are not doing well or get worse.   This information  is not intended to replace advice given to you by your health care provider. Make sure you discuss any questions you have with your health care provider.   Document Released: 10/14/2004 Document Revised: 09/25/2014 Document Reviewed: 09/13/2012 Elsevier Interactive Patient Education 2016 [place] may take the hydrocodone prescribed for pain relief if needed.  This will make you drowsy - do not drive within 4 hours of taking this medication.

## 2015-04-12 NOTE — ED Notes (Signed)
Patient c/o constant upper abd pain with generalized abd cramping x1 week. Patient reports some nausea but denies any vomiting, fevers, or urinary symptoms. Per patient diarrhea x1 on Tuesday but none since. Per patient last normal BM this morning. Denies any blood in stools.

## 2015-04-14 NOTE — ED Provider Notes (Signed)
CSN: [id number]     Arrival date & time 04/12/15  [id number] History   First MD Initiated Contact with Patient 04/12/15 [phone]     Chief Complaint  Patient presents with  . Abdominal Pain     (Consider location/radiation/quality/duration/timing/severity/associated sxs/prior Treatment) The history is provided by the patient.   Kathleen Price is a 45 y.o. female with past medical history significant for IBS and surgical history of cholecystectomy and tubal ligation with ablation presenting with a 1 week history of constant upper abdominal aching pain but endorses generalized intermittent cramping associated with mild nausea, but denies vomiting.  Also denies fevers, constipation, had one episode of diarrhea 3 days ago and resolved without intervention.  Her last normal bm with this am.  She does endorse decreased appetite but has maintained po intake.  She has found no alleviators.       Past Medical History  Diagnosis Date  . Headache(784.0)   . IBS (irritable bowel syndrome)    Past Surgical History  Procedure Laterality Date  . Cesarean section    . Cholecystectomy    . Tubal ligation    . Ablation     History reviewed. No pertinent family history. Social History  Substance Use Topics  . Smoking status: Current Every Day Smoker -- 0.50 packs/day for 25 years    Types: Cigarettes  . Smokeless tobacco: Never Used  . Alcohol Use: No   OB History    Gravida Para Term Preterm AB TAB SAB Ectopic Multiple Living   3 3 3       3      Review of Systems  Constitutional: Negative for fever and chills.  HENT: Negative for congestion and sore throat.   Eyes: Negative.   Respiratory: Negative for chest tightness and shortness of breath.   Cardiovascular: Negative for chest pain.  Gastrointestinal: Positive for nausea, abdominal pain and diarrhea. Negative for vomiting.  Genitourinary: Negative.  Negative for dysuria and vaginal discharge.  Musculoskeletal: Negative for joint swelling,  arthralgias and neck pain.  Skin: Negative.  Negative for rash and wound.  Neurological: Negative for dizziness, weakness, light-headedness, numbness and headaches.  Psychiatric/Behavioral: Negative.       Allergies  Wellbutrin  Home Medications   Prior to Admission medications   Medication Sig Start Date End Date Taking? Authorizing Provider  DULoxetine (CYMBALTA) 60 MG capsule Take 60 mg by mouth daily.   Yes Historical Provider, MD  ibuprofen (ADVIL,MOTRIN) 200 MG tablet Take 800 mg by mouth every 6 (six) hours as needed for pain.   Yes Historical Provider, MD  omeprazole (PRILOSEC OTC) 20 MG tablet Take 20 mg by mouth daily as needed (acid reflux/upset stomach).   Yes Historical Provider, MD  HYDROcodone-acetaminophen (NORCO/VICODIN) 5-325 MG tablet Take 1 tablet by mouth every 4 (four) hours as needed. 04/12/15   Evalee Jefferson, PA-C  ondansetron (ZOFRAN ODT) 8 MG disintegrating tablet Take 1 tablet (8 mg total) by mouth every 8 (eight) hours as needed for nausea or vomiting. 04/12/15   Evalee Jefferson, PA-C   BP 115/79 mmHg  Pulse 82  Temp(Src) 97.8 F (36.6 C) (Oral)  Resp 16  Ht 5\' 3"  (1.6 m)  Wt 63.504 kg  BMI 24.81 kg/m2  SpO2 100% Physical Exam  Constitutional: She appears well-developed and well-nourished.  HENT:  Head: Normocephalic and atraumatic.  Eyes: Conjunctivae are normal.  Neck: Normal range of motion.  Cardiovascular: Normal rate, regular rhythm, normal heart sounds and intact distal pulses.  Pulmonary/Chest: Effort normal and breath sounds normal. She has no wheezes.  Abdominal: Soft. Bowel sounds are normal. There is tenderness in the right lower quadrant and epigastric area. There is no guarding, no CVA tenderness, no tenderness at McBurney's point and negative Murphy's sign.  Musculoskeletal: Normal range of motion.  Neurological: She is alert.  Skin: Skin is warm and dry.  Psychiatric: She has a normal mood and affect.  Nursing note and vitals  reviewed.   ED Course  Procedures (including critical care time) Labs Review Labs Reviewed  COMPREHENSIVE METABOLIC PANEL - Abnormal; Notable for the following:    Calcium 8.8 (*)    All other components within normal limits  CBC - Abnormal; Notable for the following:    WBC 11.6 (*)    All other components within normal limits  URINALYSIS, ROUTINE W REFLEX MICROSCOPIC (NOT AT [facility]) - Abnormal; Notable for the following:    Color, Urine STRAW (*)    All other components within normal limits  LIPASE, BLOOD  PREGNANCY, URINE    Imaging Review  Final result by Rad Results In Interface (04/12/15 14:22:06)   Narrative:   CLINICAL DATA: Mid abdominal pain for 1 week with nausea.  EXAM: CT ABDOMEN AND PELVIS WITH CONTRAST  TECHNIQUE: Multidetector CT imaging of the abdomen and pelvis was performed using the standard protocol following bolus administration of intravenous contrast.  CONTRAST: 151mL OMNIPAQUE IOHEXOL 300 MG/ML SOLN  COMPARISON: None.  FINDINGS: Lower chest: No acute findings.  Hepatobiliary: No masses or other significant abnormality.  Pancreas: No mass, inflammatory changes, or other significant abnormality.  Spleen: Within normal limits in size and appearance.  Adrenals/Urinary Tract: No masses identified. No evidence of hydronephrosis.  Stomach/Bowel: No evidence of obstruction or abnormal fluid collections. There is circumferential wall thickening of the distal small bowel, with mild stranding of the terminal ileum, suspicious for Crohn's disease. Inflammatory/ infectious causes of non granulomatous enteritis are less favored. No perforation or abscess.  Vascular/Lymphatic: No pathologically enlarged lymph nodes. No evidence of abdominal aortic aneurysm.  Reproductive: No mass or other significant abnormality. Nonspecific fluid lower uterine segment may be related to history of endometrial ablation.  Other: None.  Musculoskeletal:  No suspicious bone lesions identified.  IMPRESSION: Abnormal thickened loop of distal small bowel, with lesser inflammatory change in the terminal ileum, favored to represent Crohn's disease. No perforation or abscess.   Electronically Signed By: Staci Righter M.D. On: 04/12/2015 14:22     I have personally reviewed and evaluated these images and lab results as part of my medical decision-making.   EKG Interpretation None      MDM   Final diagnoses:  Ileitis, terminal, without complications ([place])  Lower abdominal pain    Patients  labs reviewed.  Radiological studies were viewed, interpreted and considered during the medical decision making and disposition process. I agree with radiologists reading.  Results were also discussed with patient.  Referral made to GI for further eval/management. ? IBS really mild Crohns.  Pt was seen by Dr. Regenia Skeeter prior to dc home.  Prescribed zofran, hydrocodone for prn use.     Evalee Jefferson, PA-C 04/14/15 1618  Sherwood Gambler, MD 04/17/15 1447

## 2015-05-15 MED FILL — DULoxetine HCL 60 MG CPEP: 60 | 90 days supply | Qty: 90 | Fill #1

## 2015-06-03 DIAGNOSIS — L602 Onychogryphosis: Secondary | ICD-10-CM | POA: Diagnosis not present

## 2015-06-03 DIAGNOSIS — B351 Tinea unguium: Secondary | ICD-10-CM | POA: Diagnosis not present

## 2015-06-03 DIAGNOSIS — E782 Mixed hyperlipidemia: Secondary | ICD-10-CM | POA: Diagnosis not present

## 2015-08-26 DIAGNOSIS — M25511 Pain in right shoulder: Secondary | ICD-10-CM | POA: Diagnosis not present

## 2015-08-26 DIAGNOSIS — G4719 Other hypersomnia: Secondary | ICD-10-CM | POA: Diagnosis not present

## 2015-09-03 MED FILL — DULoxetine HCL 60 MG CPEP: 60 | 90 days supply | Qty: 90 | Fill #2

## 2015-12-03 MED FILL — DULoxetine HCL 60 MG CPEP: 60 | 90 days supply | Qty: 90 | Fill #0

## 2016-03-16 DIAGNOSIS — H524 Presbyopia: Secondary | ICD-10-CM | POA: Diagnosis not present

## 2016-03-16 DIAGNOSIS — H52221 Regular astigmatism, right eye: Secondary | ICD-10-CM | POA: Diagnosis not present

## 2016-03-16 DIAGNOSIS — H5213 Myopia, bilateral: Secondary | ICD-10-CM | POA: Diagnosis not present

## 2016-03-19 DIAGNOSIS — Z23 Encounter for immunization: Secondary | ICD-10-CM | POA: Diagnosis not present

## 2016-03-23 DIAGNOSIS — E782 Mixed hyperlipidemia: Secondary | ICD-10-CM | POA: Diagnosis not present

## 2016-03-25 DIAGNOSIS — F339 Major depressive disorder, recurrent, unspecified: Secondary | ICD-10-CM | POA: Diagnosis not present

## 2016-03-25 DIAGNOSIS — Z0000 Encounter for general adult medical examination without abnormal findings: Secondary | ICD-10-CM | POA: Diagnosis not present

## 2016-03-25 DIAGNOSIS — E782 Mixed hyperlipidemia: Secondary | ICD-10-CM | POA: Diagnosis not present

## 2016-03-25 DIAGNOSIS — K582 Mixed irritable bowel syndrome: Secondary | ICD-10-CM | POA: Diagnosis not present

## 2016-03-25 DIAGNOSIS — F411 Generalized anxiety disorder: Secondary | ICD-10-CM | POA: Diagnosis not present

## 2016-03-25 DIAGNOSIS — Z720 Tobacco use: Secondary | ICD-10-CM | POA: Diagnosis not present

## 2016-04-01 MED FILL — DULoxetine HCL 60 MG CPEP: 60 | 90 days supply | Qty: 90 | Fill #1

## 2016-06-07 ENCOUNTER — Telehealth: Payer: Self-pay | Admitting: Gastroenterology

## 2016-06-07 ENCOUNTER — Emergency Department (HOSPITAL_COMMUNITY)
Admission: EM | Admit: 2016-06-07 | Discharge: 2016-06-07 | Disposition: A | Discharge status: Home / Self Care | Payer: 59 | Attending: Emergency Medicine | Admitting: Emergency Medicine

## 2016-06-07 ENCOUNTER — Encounter (HOSPITAL_COMMUNITY): Payer: Self-pay | Admitting: Emergency Medicine

## 2016-06-07 ENCOUNTER — Emergency Department (HOSPITAL_COMMUNITY): Payer: 59

## 2016-06-07 DIAGNOSIS — F17210 Nicotine dependence, cigarettes, uncomplicated: Secondary | ICD-10-CM | POA: Diagnosis not present

## 2016-06-07 DIAGNOSIS — R1031 Right lower quadrant pain: Secondary | ICD-10-CM | POA: Insufficient documentation

## 2016-06-07 DIAGNOSIS — R1030 Lower abdominal pain, unspecified: Secondary | ICD-10-CM

## 2016-06-07 DIAGNOSIS — R109 Unspecified abdominal pain: Secondary | ICD-10-CM | POA: Diagnosis not present

## 2016-06-07 HISTORY — DX: Anxiety disorder, unspecified: F41.9

## 2016-06-07 LAB — LIPASE, BLOOD: LIPASE: 19 U/L (ref 11–51)

## 2016-06-07 LAB — URINALYSIS, ROUTINE W REFLEX MICROSCOPIC
Bilirubin Urine: NEGATIVE
GLUCOSE, UA: NEGATIVE mg/dL
Ketones, ur: 5 mg/dL — AB
Leukocytes, UA: NEGATIVE
NITRITE: NEGATIVE
PH: 6 (ref 5.0–8.0)
Protein, ur: NEGATIVE mg/dL
SPECIFIC GRAVITY, URINE: 1.023 (ref 1.005–1.030)

## 2016-06-07 LAB — COMPREHENSIVE METABOLIC PANEL
ALT: 30 U/L (ref 14–54)
ANION GAP: 9 (ref 5–15)
AST: 32 U/L (ref 15–41)
Albumin: 3.8 g/dL (ref 3.5–5.0)
Alkaline Phosphatase: 52 U/L (ref 38–126)
BILIRUBIN TOTAL: 0.7 mg/dL (ref 0.3–1.2)
BUN: 10 mg/dL (ref 6–20)
CHLORIDE: 101 mmol/L (ref 101–111)
CO2: 26 mmol/L (ref 22–32)
Calcium: 9 mg/dL (ref 8.9–10.3)
Creatinine, Ser: 0.73 mg/dL (ref 0.44–1.00)
Glucose, Bld: 103 mg/dL — ABNORMAL HIGH (ref 65–99)
POTASSIUM: 3.5 mmol/L (ref 3.5–5.1)
Sodium: 136 mmol/L (ref 135–145)
TOTAL PROTEIN: 6.9 g/dL (ref 6.5–8.1)

## 2016-06-07 LAB — CBC
HCT: 41.9 % (ref 36.0–46.0)
HEMOGLOBIN: 14.7 g/dL (ref 12.0–15.0)
MCH: 31.4 pg (ref 26.0–34.0)
MCHC: 35.1 g/dL (ref 30.0–36.0)
MCV: 89.5 fL (ref 78.0–100.0)
PLATELETS: 298 10*3/uL (ref 150–400)
RBC: 4.68 MIL/uL (ref 3.87–5.11)
RDW: 12.7 % (ref 11.5–15.5)
WBC: 13.3 10*3/uL — AB (ref 4.0–10.5)

## 2016-06-07 LAB — PREGNANCY, URINE: Preg Test, Ur: NEGATIVE

## 2016-06-07 MED ORDER — ONDANSETRON HCL 4 MG/2ML IJ SOLN
4.0000 mg | Freq: Once | INTRAMUSCULAR | Status: AC
  Administered 2016-06-07: 4 mg via INTRAVENOUS
  Filled 2016-06-07: qty 2

## 2016-06-07 MED ORDER — IOPAMIDOL (ISOVUE-300) INJECTION 61%
100.0000 mL | Freq: Once | INTRAVENOUS | Status: AC | PRN
  Administered 2016-06-07: 100 mL via INTRAVENOUS

## 2016-06-07 MED ORDER — ONDANSETRON HCL 4 MG PO TABS: 4.0000 mg | ORAL_TABLET | Freq: Four times a day (QID) | ORAL | 0 refills | Status: DC

## 2016-06-07 MED ORDER — OXYCODONE-ACETAMINOPHEN 5-325 MG PO TABS: 1.0000 | ORAL_TABLET | ORAL | 0 refills | Status: DC | PRN

## 2016-06-07 MED ORDER — MORPHINE SULFATE (PF) 4 MG/ML IV SOLN
4.0000 mg | Freq: Once | INTRAVENOUS | Status: AC
  Administered 2016-06-07: 4 mg via INTRAVENOUS
  Filled 2016-06-07: qty 1

## 2016-06-07 MED ORDER — SODIUM CHLORIDE 0.9 % IV BOLUS (SEPSIS)
1000.0000 mL | Freq: Once | INTRAVENOUS | Status: AC
  Administered 2016-06-07: 1000 mL via INTRAVENOUS

## 2016-06-07 MED ORDER — HYDROMORPHONE HCL 1 MG/ML IJ SOLN
1.0000 mg | Freq: Once | INTRAMUSCULAR | Status: AC
  Administered 2016-06-07: 1 mg via INTRAVENOUS
  Filled 2016-06-07: qty 1

## 2016-06-07 NOTE — Telephone Encounter (Addendum)
REVIEWED.  I DID NOT SEE PT IN ED IT WAS A PHONE CONSULT. PT ASKED IF SHE WANTED TO SEE DR. Gala Romney OR DR. Laural Golden AND SHE ASKED FOR DR. Laural Golden.

## 2016-06-07 NOTE — Telephone Encounter (Signed)
PT SEEN IN ED FOR RECTAL BLEEDING/ABDOMINAL PAIN. SPOKE WITH DR. Roderic Palau. PT REQUEST TO SEE DR. Laural Golden AS OUTPATIENT. NEEDS OPV ASAP TO BE EVALUATED FOR IBD. PT INSTRUCTED TO AVOID ASA/NSAIDS.

## 2016-06-07 NOTE — ED Triage Notes (Signed)
PT c/o RLQ abdominal pain with last BM 06/04/16. PT denies any urinary symptoms or vaginal discharge. PT also c/o some cold chills over the past week.

## 2016-06-07 NOTE — Telephone Encounter (Signed)
Pt's husband called to say that his wife was in the ER and needed to see SF before next week. I told him that [facility] notes from today said that she needed to follow up with NUR asap. Lacretia Nicks from [place] office had sent phone note to the schedule OV ASAP. Pt's husband said that it was too long to wait and she needed to see SF this week. I told him that we do not see each other's patient. He said that [facility] was seeing her in the ER and I told him that's because she was on call at the hospital.

## 2016-06-07 NOTE — Telephone Encounter (Signed)
Patient has been given an appointment with Deberah Castle, NP for 06/08/16

## 2016-06-07 NOTE — Telephone Encounter (Signed)
Noted, we rec'd message from Dr Oneida Alar to sch' [place] will call patient

## 2016-06-07 NOTE — Discharge Instructions (Signed)
Call Dr. Olevia Perches office to arrange a follow-up appt. Return here for any worsening symptoms such as fever, increased pain, vomiting

## 2016-06-07 NOTE — Telephone Encounter (Signed)
Forwarded to [facility] to schedule

## 2016-06-08 ENCOUNTER — Encounter (INDEPENDENT_AMBULATORY_CARE_PROVIDER_SITE_OTHER): Payer: Self-pay | Admitting: Internal Medicine

## 2016-06-08 ENCOUNTER — Ambulatory Visit (INDEPENDENT_AMBULATORY_CARE_PROVIDER_SITE_OTHER): Payer: 59 | Admitting: Internal Medicine

## 2016-06-08 VITALS — BP 126/84 | HR 72 | Temp 98.1°F | Ht 63.0 in | Wt 153.0 lb

## 2016-06-08 DIAGNOSIS — K5000 Crohn's disease of small intestine without complications: Secondary | ICD-10-CM | POA: Diagnosis not present

## 2016-06-08 LAB — HEPATITIS B SURFACE ANTIGEN: HEP B S AG: NEGATIVE

## 2016-06-08 MED ORDER — METRONIDAZOLE 250 MG PO TABS: 250.0000 mg | ORAL_TABLET | Freq: Three times a day (TID) | ORAL | 0 refills | Status: DC

## 2016-06-08 MED ORDER — CIPROFLOXACIN HCL 500 MG PO TABS: 500.0000 mg | ORAL_TABLET | Freq: Two times a day (BID) | ORAL | 0 refills | Status: DC

## 2016-06-08 NOTE — Progress Notes (Addendum)
Subjective:    Patient ID: Kathleen Price, female    DOB: 11/24/1970, 46 y.o.   MRN: [id number] Patient is allergic to the TB test.  HPI Referred to our office from the ED at [facility]. Seen at [facility] ED yesterday with abdominal pain. The pain was located rt lower abdomen. Abdominal pain x 5 days.  She says she was constipated which is normal for her . No blood in her stools. No weight loss. She had nausea but no vomiting. No fever.  Ct revealed increased wall thickening involving the distal jejunum or proximal ileum consistent with hx of Crohn's disease. She had similar symptoms in 2017 and underwent a CT which was suspicious for Crohn's disease.  She has never been told she had Crohn's disease. No family hx of Crohn's disease. She feels a little bit better today. Rates pain 5/10 this am.  Has received the Hep B series.  Just recentlyed test for Quantiferon for her TB. She will bring results in for this.     06/07/2016 CT abdomen/pelvis with CM: Rt lower quadrant pain. IMPRESSION: Increased wall thickening is seen involving the distal jejunum or proximal ileum compared to prior exam, consistent with history of Crohn's disease. Small amount of free fluid is now noted in the pelvis as well consistent with inflammatory bowel disease.   04/12/2015 CT abdomen/pelvis with CM: Mid abdominal pain x 1 week with nausea IMPRESSION: Abnormal thickened loop of distal small bowel, with lesser inflammatory change in the terminal ileum, favored to represent Crohn's disease. No perforation or abscess.  12/08/2009 Colonoscopy: hx of IBS and diarrhea: Redundant colon with normal mucosa. External hemorrhoids.   Review of Systems Past Medical History:  Diagnosis Date  . Anxiety   . Headache(784.0)   . IBS (irritable bowel syndrome)     Past Surgical History:  Procedure Laterality Date  . ABLATION    . CESAREAN SECTION    . CHOLECYSTECTOMY    . TUBAL LIGATION      Allergies  Allergen Reactions    . Tuberulin Old Human [Old Tuberculin]     Severe allergic skin reaction  . Wellbutrin [Bupropion Hcl] Hives    Current Outpatient Prescriptions on File Prior to Visit  Medication Sig Dispense Refill  . DULoxetine (CYMBALTA) 60 MG capsule Take 60 mg by mouth daily.    Marland Kitchen ibuprofen (ADVIL,MOTRIN) 200 MG tablet Take 800 mg by mouth every 6 (six) hours as needed for pain.    Marland Kitchen omeprazole (PRILOSEC OTC) 20 MG tablet Take 20 mg by mouth daily as needed (acid reflux/upset stomach).    . ondansetron (ZOFRAN) 4 MG tablet Take 1 tablet (4 mg total) by mouth every 6 (six) hours. 12 tablet 0  . oxyCODONE-acetaminophen (PERCOCET/ROXICET) 5-325 MG tablet Take 1 tablet by mouth every 4 (four) hours as needed. 20 tablet 0   No current facility-administered medications on file prior to visit.        Objective:   Physical Exam Blood pressure 126/84, pulse 72, temperature 98.1 F (36.7 C), height 5\' 3"  (1.6 m), weight 153 lb (69.4 kg).  Alert and oriented. Skin warm and dry. Oral mucosa is moist.   . Sclera anicteric, conjunctivae is pink. Thyroid not enlarged. No cervical lymphadenopathy. Lungs clear. Heart regular rate and rhythm.  Abdomen is soft. Bowel sounds are positive. No hepatomegaly. No abdominal masses felt. Rt mid abdominal  tenderness  extremities.      Assessment & Plan:  Probable Crohn's disease. I discussed this  case with Dr. Laural Golden CRP, Sed rate, Hep B surface antigen.   Cipro and Flagyl  Prior Authorization for Humira.  OV in 1 year

## 2016-06-08 NOTE — Patient Instructions (Addendum)
CBC, sedrate, CRP, Hep B surfave antigen. OV in 4 weeks.

## 2016-06-09 LAB — SEDIMENTATION RATE: SED RATE: 7 mm/h (ref 0–20)

## 2016-06-09 LAB — C-REACTIVE PROTEIN: CRP: 26.9 mg/L — AB (ref <8.0)

## 2016-06-12 NOTE — ED Provider Notes (Signed)
[facility] DEPT Provider Note   CSN: [id number] Arrival date & time: 06/07/16  3846     History   Chief Complaint Chief Complaint  Patient presents with  . Abdominal Pain    HPI Kathleen Price is a 46 y.o. female.  HPI  Kathleen Price is a 46 y.o. female who presents to the Emergency Department complaining of right lower abdominal pain associated with constipation for three days.  Intermittent abdominal pain and chills.  Nothing makes pain better or worse.  She denies fever, vomiting, dysuria, vaginal bleeding or discharge.     Past Medical History:  Diagnosis Date  . Anxiety   . Headache(784.0)   . IBS (irritable bowel syndrome)     There are no active problems to display for this patient.   Past Surgical History:  Procedure Laterality Date  . ABLATION    . CESAREAN SECTION    . CHOLECYSTECTOMY    . TUBAL LIGATION      OB History    Gravida Para Term Preterm AB Living   3 3 3     3    SAB TAB Ectopic Multiple Live Births                   Home Medications    Prior to Admission medications   Medication Sig Start Date End Date Taking? Authorizing Provider  DULoxetine (CYMBALTA) 60 MG capsule Take 60 mg by mouth daily.   Yes [provider]  ibuprofen (ADVIL,MOTRIN) 200 MG tablet Take 800 mg by mouth every 6 (six) hours as needed for pain.   Yes [provider]  omeprazole (PRILOSEC OTC) 20 MG tablet Take 20 mg by mouth daily as needed (acid reflux/upset stomach).   Yes [provider]  ciprofloxacin (CIPRO) 500 MG tablet Take 1 tablet (500 mg total) by mouth 2 (two) times daily. 06/08/16   Setzer, Rona Ravens, NP  metroNIDAZOLE (FLAGYL) 250 MG tablet Take 1 tablet (250 mg total) by mouth 3 (three) times daily. 06/08/16   Setzer, Rona Ravens, NP  ondansetron (ZOFRAN) 4 MG tablet Take 1 tablet (4 mg total) by mouth every 6 (six) hours. 06/07/16   Jeanetta Alonzo, PA-C  oxyCODONE-acetaminophen (PERCOCET/ROXICET) 5-325 MG tablet Take 1  tablet by mouth every 4 (four) hours as needed. 06/07/16   Kem Parkinson, PA-C    Family History History reviewed. No pertinent family history.  Social History Social History  Substance Use Topics  . Smoking status: Current Every Day Smoker    Packs/day: 0.50    Years: 25.00    Types: Cigarettes  . Smokeless tobacco: Never Used  . Alcohol use No     Allergies   Tuberulin old human [old tuberculin] and Wellbutrin [bupropion hcl]   Review of Systems Review of Systems  Constitutional: Positive for chills. Negative for appetite change and fever.  Respiratory: Negative for cough and shortness of breath.   Cardiovascular: Negative for chest pain.  Gastrointestinal: Positive for abdominal pain and constipation. Negative for blood in stool, nausea and vomiting.  Genitourinary: Negative for decreased urine volume, difficulty urinating, dysuria, flank pain, frequency, pelvic pain, vaginal bleeding and vaginal discharge.  Musculoskeletal: Negative for back pain and neck pain.  Skin: Negative for color change and rash.  Neurological: Negative for dizziness, weakness and numbness.  Hematological: Negative for adenopathy.  All other systems reviewed and are negative.    Physical Exam Updated Vital Signs BP 118/77 (BP Location: Left Arm)   Pulse 89  Temp 98.1 F (36.7 C) (Oral)   Resp 18   Ht 5\' 3"  (1.6 m)   Wt 65.8 kg (145 lb)   SpO2 97%   BMI 25.69 kg/m   Physical Exam  Constitutional: She is oriented to person, place, and time. She appears well-developed and well-nourished. No distress.  HENT:  Head: Normocephalic and atraumatic.  Mouth/Throat: Oropharynx is clear and moist.  Neck: Normal range of motion.  Cardiovascular: Normal rate, regular rhythm, normal heart sounds and intact distal pulses.   No murmur heard. Pulmonary/Chest: Effort normal and breath sounds normal. No respiratory distress.  Abdominal: Soft. Normal appearance and bowel sounds are normal. She  exhibits no distension and no mass. There is tenderness in the right lower quadrant. There is no rigidity, no rebound, no guarding, no CVA tenderness and no tenderness at McBurney's point.    Musculoskeletal: Normal range of motion. She exhibits no edema.  Neurological: She is alert and oriented to person, place, and time. She exhibits normal muscle tone. Coordination normal.  Skin: Skin is warm and dry.  Nursing note and vitals reviewed.    ED Treatments / Results  Labs (all labs ordered are listed, but only abnormal results are displayed) Labs Reviewed  COMPREHENSIVE METABOLIC PANEL - Abnormal; Notable for the following:       Result Value   Glucose, Bld 103 (*)    All other components within normal limits  CBC - Abnormal; Notable for the following:    WBC 13.3 (*)    All other components within normal limits  URINALYSIS, ROUTINE W REFLEX MICROSCOPIC - Abnormal; Notable for the following:    Color, Urine AMBER (*)    APPearance HAZY (*)    Hgb urine dipstick SMALL (*)    Ketones, ur 5 (*)    Bacteria, UA FEW (*)    Squamous Epithelial / LPF 6-30 (*)    All other components within normal limits  LIPASE, BLOOD  PREGNANCY, URINE    EKG  EKG Interpretation  Date/Time:  Monday Jun 07 2016 11:13:05 EDT Ventricular Rate:  73 PR Interval:    QRS Duration: 98 QT Interval:  380 QTC Calculation: 419 R Axis:   49 Text Interpretation:  Sinus rhythm Low voltage, extremity and precordial leads No significant change since last tracing 01 Apr 2008 Confirmed by Rolland Porter [phone]) on 06/08/2016 4:52:37 PM       Radiology Ct Abdomen Pelvis W Contrast  Result Date: 06/07/2016 CLINICAL DATA:  Acute right lower quadrant abdominal pain. EXAM: CT ABDOMEN AND PELVIS WITH CONTRAST TECHNIQUE: Multidetector CT imaging of the abdomen and pelvis was performed using the standard protocol following bolus administration of intravenous contrast. CONTRAST:  176mL ISOVUE-300 IOPAMIDOL (ISOVUE-300)  INJECTION 61% COMPARISON:  CT scan of April 12, 2015. FINDINGS: Lower chest: No acute abnormality. Hepatobiliary: Status post cholecystectomy. Stable mild intrahepatic and extrahepatic biliary dilatation is noted which most likely is due to post cholecystectomy status. No focal hepatic abnormality is noted. Pancreas: Unremarkable. No pancreatic ductal dilatation or surrounding inflammatory changes. Spleen: Normal in size without focal abnormality. Adrenals/Urinary Tract: Adrenal glands are unremarkable. Kidneys are normal, without renal calculi, focal lesion, or hydronephrosis. Bladder is unremarkable. Stomach/Bowel: The appendix appears normal. There is noted significant wall thickening of the distal jejunum or proximal ileum which is increased compared to prior exam consistent with the history of inflammatory bowel disease. There is no evidence of bowel obstruction. Vascular/Lymphatic: No significant vascular findings are present. No enlarged abdominal or pelvic lymph  nodes. Reproductive: Uterus and bilateral adnexa are unremarkable. Other: Small amount of free fluid is noted in the dependent portion of the pelvis. No hernia is noted. Musculoskeletal: No acute or significant osseous findings. IMPRESSION: Increased wall thickening is seen involving the distal jejunum or proximal ileum compared to prior exam, consistent with history of Crohn's disease. Small amount of free fluid is now noted in the pelvis as well consistent with inflammatory bowel disease. Electronically Signed   By: Marijo Conception, M.D.   On: 06/07/2016 12:10     Procedures Procedures (including critical care time)  Medications Ordered in ED Medications  sodium chloride 0.9 % bolus 1,000 mL (0 mLs Intravenous Stopped 06/07/16 1444)  ondansetron (ZOFRAN) injection 4 mg (4 mg Intravenous Given 06/07/16 0952)  morphine 4 MG/ML injection 4 mg (4 mg Intravenous Given 06/07/16 0951)  iopamidol (ISOVUE-300) 61 % injection 100 mL (100 mLs  Intravenous Contrast Given 06/07/16 1131)  morphine 4 MG/ML injection 4 mg (4 mg Intravenous Given 06/07/16 1249)  ondansetron (ZOFRAN) injection 4 mg (4 mg Intravenous Given 06/07/16 1249)  HYDROmorphone (DILAUDID) injection 1 mg (1 mg Intravenous Given 06/07/16 1340)     Initial Impression / Assessment and Plan / ED Course  I have reviewed the triage vital signs and the nursing notes.  Pertinent labs & imaging results that were available during my care of the patient were reviewed by me and considered in my medical decision making (see chart for details).     Pt non-toxic appearing.  Pain improved after IV medications.    Discussed CT result  and labs with Dr. Roderic Palau and pt.  No familial hx or previous hx of Crohn's. Doubt surgical abdomen.   Pt findings were discussed with Dr. Nona Dell by Dr. Roderic Palau.  Pt appears stable for d/c and she is previously pt of Dr. Laural Golden and prefers to f/u with him in his office.  Strict return precautions discussed.    Final Clinical Impressions(s) / ED Diagnoses   Final diagnoses:  Lower abdominal pain    New Prescriptions Discharge Medication List as of 06/07/2016  2:21 PM    START taking these medications   Details  ondansetron (ZOFRAN) 4 MG tablet Take 1 tablet (4 mg total) by mouth every 6 (six) hours., Starting Mon 06/07/2016, Print    oxyCODONE-acetaminophen (PERCOCET/ROXICET) 5-325 MG tablet Take 1 tablet by mouth every 4 (four) hours as needed., Starting Mon 06/07/2016, Print         [place], [place], PA-C 06/12/16 1701    Milton Ferguson, MD 06/16/16 1252

## 2016-06-21 ENCOUNTER — Telehealth (INDEPENDENT_AMBULATORY_CARE_PROVIDER_SITE_OTHER): Payer: Self-pay | Admitting: Internal Medicine

## 2016-06-21 NOTE — Telephone Encounter (Signed)
err

## 2016-06-23 ENCOUNTER — Encounter (INDEPENDENT_AMBULATORY_CARE_PROVIDER_SITE_OTHER): Payer: Self-pay

## 2016-06-23 ENCOUNTER — Telehealth (INDEPENDENT_AMBULATORY_CARE_PROVIDER_SITE_OTHER): Payer: Self-pay | Admitting: Internal Medicine

## 2016-06-23 NOTE — Telephone Encounter (Signed)
Terri- When we do Humira we do the following. Day 1 - 80 mg SQ Day 2- 80 mg SQ Day 14- 40 mg SQ Day 15- 40 mg SQ There after the patient will administer 40mg  every 14 days.  Let me know if you want to precede with your previous instructions or follow these. PA will be completed at a later date.

## 2016-06-23 NOTE — Telephone Encounter (Signed)
Will start on Humira.  160mg  1st day. 80 mg in 2 weeks (day 15) 40mg  in 2 weeks (day 29). Then 40mg  every 2 weeks.

## 2016-06-24 NOTE — Telephone Encounter (Signed)
Kathleen Price, this is fine.

## 2016-06-28 NOTE — Telephone Encounter (Signed)
I will work on this 06/30/2016 or 07/01/2016.

## 2016-06-30 NOTE — Telephone Encounter (Signed)
A PA has been completed based  upon the information provided. It will be 24- 48 hours before a response.

## 2016-07-06 ENCOUNTER — Telehealth (INDEPENDENT_AMBULATORY_CARE_PROVIDER_SITE_OTHER): Payer: Self-pay | Admitting: Internal Medicine

## 2016-07-06 ENCOUNTER — Encounter (INDEPENDENT_AMBULATORY_CARE_PROVIDER_SITE_OTHER): Payer: Self-pay | Admitting: Internal Medicine

## 2016-07-06 ENCOUNTER — Other Ambulatory Visit (INDEPENDENT_AMBULATORY_CARE_PROVIDER_SITE_OTHER): Payer: Self-pay | Admitting: Internal Medicine

## 2016-07-06 ENCOUNTER — Ambulatory Visit (INDEPENDENT_AMBULATORY_CARE_PROVIDER_SITE_OTHER): Payer: 59 | Admitting: Internal Medicine

## 2016-07-06 VITALS — BP 102/50 | HR 60 | Temp 98.2°F | Ht 63.0 in | Wt 155.9 lb

## 2016-07-06 DIAGNOSIS — R1031 Right lower quadrant pain: Secondary | ICD-10-CM | POA: Diagnosis not present

## 2016-07-06 NOTE — Telephone Encounter (Signed)
Kathleen Price, Dummy capsule study.  Thank u

## 2016-07-06 NOTE — Progress Notes (Signed)
Subjective:    Patient ID: Kathleen Price, female    DOB: [birth year], 46 y.o.   MRN: [id number]  HPI  Here today for f/u. She was last seen in May.  Seen in the ED in May with abdominal pain. Pain rt lower quadrant x 5 days. Nausea but no vomiting.  No fever.  Ct revealed increased wall thickening involving the distal jejunum or proximal ileum consistent with hx of Crohn's disease. She had similar symptoms in 2017 and underwent a CT which was suspicious for Crohn's disease.  She was covered with Cipro and Flagyl. Her insurance has denied Humira. Today she has no abdominal pain. Her appetite is good. No weight loss. She is having one BM a day. No melena or BRRB. Se leans toward constipation and take Colace x 2 a day and has increased water in her diet.  CBC    Component Value Date/Time   WBC 13.3 (H) 06/07/2016 0840   RBC 4.68 06/07/2016 0840   HGB 14.7 06/07/2016 0840   HCT 41.9 06/07/2016 0840   PLT 298 06/07/2016 0840   MCV 89.5 06/07/2016 0840   MCH 31.4 06/07/2016 0840   MCHC 35.1 06/07/2016 0840   RDW 12.7 06/07/2016 0840   LYMPHSABS 1.9 02/28/2011 1505   MONOABS 0.6 02/28/2011 1505   EOSABS 0.2 02/28/2011 1505   BASOSABS 0.0 02/28/2011 1505   Erythrocyte Sedimentation Rate     Component Value Date/Time   ESRSEDRATE 7 06/08/2016 1013   06/08/2016 Hep B surface antigen negative.    06/07/2016 CT abdomen/pelvis with CM: Rt lower quadrant pain. IMPRESSION: Increased wall thickening is seen involving the distal jejunum or proximal ileum compared to prior exam, consistent with history of Crohn's disease. Small amount of free fluid is now noted in the pelvis as well consistent with inflammatory bowel disease.   04/12/2015 CT abdomen/pelvis with CM: Mid abdominal pain x 1 week with nausea IMPRESSION: Abnormal thickened loop of distal small bowel, with lesser inflammatory change in the terminal ileum, favored to represent Crohn's disease. No perforation or  abscess.  12/08/2009 Colonoscopy: hx of IBS and diarrhea: Redundant colon with normal mucosa. External hemorrhoids.    Review of Systems    Past Medical History:  Diagnosis Date  . Anxiety   . Headache(784.0)   . IBS (irritable bowel syndrome)     Past Surgical History:  Procedure Laterality Date  . ABLATION    . CESAREAN SECTION    . CHOLECYSTECTOMY    . TUBAL LIGATION      Allergies  Allergen Reactions  . Tuberulin Old Human [Old Tuberculin]     Severe allergic skin reaction  . Wellbutrin [Bupropion Hcl] Hives    Current Outpatient Prescriptions on File Prior to Visit  Medication Sig Dispense Refill  . DULoxetine (CYMBALTA) 60 MG capsule Take 60 mg by mouth daily.    Marland Kitchen ibuprofen (ADVIL,MOTRIN) 200 MG tablet Take 800 mg by mouth every 6 (six) hours as needed for pain.    Marland Kitchen omeprazole (PRILOSEC OTC) 20 MG tablet Take 20 mg by mouth daily as needed (acid reflux/upset stomach).    . ondansetron (ZOFRAN) 4 MG tablet Take 1 tablet (4 mg total) by mouth every 6 (six) hours. 12 tablet 0  . ciprofloxacin (CIPRO) 500 MG tablet Take 1 tablet (500 mg total) by mouth 2 (two) times daily. (Patient not taking: Reported on 07/06/2016) 28 tablet 0  . metroNIDAZOLE (FLAGYL) 250 MG tablet Take 1 tablet (250 mg total) by mouth  3 (three) times daily. (Patient not taking: Reported on 07/06/2016) 42 tablet 0  . oxyCODONE-acetaminophen (PERCOCET/ROXICET) 5-325 MG tablet Take 1 tablet by mouth every 4 (four) hours as needed. (Patient not taking: Reported on 07/06/2016) 20 tablet 0   No current facility-administered medications on file prior to visit.       Objective:   Physical Exam Blood pressure (!) 102/50, pulse 60, temperature 98.2 F (36.8 C), height 5\' 3"  (1.6 m), weight 155 lb 14.4 oz (70.7 kg). Alert and oriented. Skin warm and dry. Oral mucosa is moist.   . Sclera anicteric, conjunctivae is pink. Thyroid not enlarged. No cervical lymphadenopathy. Lungs clear. Heart regular rate and  rhythm.  Abdomen is soft. Bowel sounds are positive. No hepatomegaly. No abdominal masses felt. No tenderness.  No edema to lower extremities.           Assessment & Plan:  Colitis. Possible Crohn's. IBD panel. Dummy Capsule study. I discussed with Dr. Laural Golden.

## 2016-07-06 NOTE — Patient Instructions (Addendum)
OV pending IBD panel. Dummy capsule study

## 2016-07-11 LAB — INFLAMMATORY BOWEL DISEASE PROFILE
ANCA Screen: NEGATIVE
Myeloperoxidase Abs: <1
SACCHAROMYCES CEREVISIAE, IGA: 12 U (ref <=20.0)
Saccharomyces cerevisiae IgG: 13.1 U (ref <=20.0)
Serine Protease 3: <1

## 2016-07-11 LAB — INFLAM. BOWEL DISEASE DIFF. PANEL
SACCHAROMYCES CEREVISIAE IGG: 13.1 U (ref <=20.0)
SACCHAROMYCES CEREVISIAE, IGA: 12 U (ref <=20.0)

## 2016-07-13 ENCOUNTER — Other Ambulatory Visit (INDEPENDENT_AMBULATORY_CARE_PROVIDER_SITE_OTHER): Payer: Self-pay | Admitting: Internal Medicine

## 2016-07-13 DIAGNOSIS — K5010 Crohn's disease of large intestine without complications: Secondary | ICD-10-CM

## 2016-07-13 MED FILL — DULoxetine HCL 60 MG CPEP: 60 | 90 days supply | Qty: 90 | Fill #2

## 2016-07-13 NOTE — Telephone Encounter (Signed)
Agile capsule study sch'd 07/23/16 at [phone]), left detailed message for patient

## 2016-07-26 ENCOUNTER — Encounter (HOSPITAL_COMMUNITY)
Admission: RE | Disposition: A | Discharge status: Home / Self Care | Payer: Self-pay | Source: Ambulatory Visit | Attending: Internal Medicine

## 2016-07-26 ENCOUNTER — Ambulatory Visit (HOSPITAL_COMMUNITY)
Admission: RE | Admit: 2016-07-26 | Discharge: 2016-07-26 | Disposition: A | Discharge status: Home / Self Care | Payer: 59 | Source: Ambulatory Visit | Attending: Internal Medicine | Admitting: Internal Medicine

## 2016-07-26 DIAGNOSIS — R109 Unspecified abdominal pain: Secondary | ICD-10-CM | POA: Insufficient documentation

## 2016-07-26 DIAGNOSIS — K5010 Crohn's disease of large intestine without complications: Secondary | ICD-10-CM | POA: Insufficient documentation

## 2016-07-26 HISTORY — PX: AGILE CAPSULE: SHX5420

## 2016-07-26 SURGERY — AGILE CAPSULE

## 2016-07-27 ENCOUNTER — Encounter (HOSPITAL_COMMUNITY): Payer: Self-pay | Admitting: Internal Medicine

## 2016-07-27 ENCOUNTER — Ambulatory Visit (HOSPITAL_COMMUNITY)
Admission: RE | Admit: 2016-07-27 | Discharge: 2016-07-27 | Disposition: A | Discharge status: Home / Self Care | Payer: 59 | Source: Ambulatory Visit | Attending: Internal Medicine | Admitting: Internal Medicine

## 2016-07-27 ENCOUNTER — Other Ambulatory Visit (INDEPENDENT_AMBULATORY_CARE_PROVIDER_SITE_OTHER): Payer: Self-pay | Admitting: Internal Medicine

## 2016-07-27 DIAGNOSIS — T189XXA Foreign body of alimentary tract, part unspecified, initial encounter: Secondary | ICD-10-CM | POA: Insufficient documentation

## 2016-07-27 DIAGNOSIS — X58XXXA Exposure to other specified factors, initial encounter: Secondary | ICD-10-CM | POA: Diagnosis not present

## 2016-07-27 DIAGNOSIS — T184XXA Foreign body in colon, initial encounter: Secondary | ICD-10-CM | POA: Diagnosis not present

## 2016-07-27 DIAGNOSIS — K5010 Crohn's disease of large intestine without complications: Secondary | ICD-10-CM

## 2016-07-28 ENCOUNTER — Telehealth (INDEPENDENT_AMBULATORY_CARE_PROVIDER_SITE_OTHER): Payer: Self-pay | Admitting: Internal Medicine

## 2016-07-28 ENCOUNTER — Encounter (INDEPENDENT_AMBULATORY_CARE_PROVIDER_SITE_OTHER): Payer: Self-pay | Admitting: *Deleted

## 2016-07-28 NOTE — Telephone Encounter (Signed)
I have spoken with patient.  GIven capsule.

## 2016-07-28 NOTE — Telephone Encounter (Signed)
GIVENS sch'd 08/02/16 at [phone]), left detailed message for patient, also sent My[employer]ah[employer] message with instructions

## 2016-08-02 ENCOUNTER — Encounter (HOSPITAL_COMMUNITY): Payer: Self-pay | Admitting: *Deleted

## 2016-08-02 ENCOUNTER — Encounter (HOSPITAL_COMMUNITY)
Admission: RE | Disposition: A | Discharge status: Home / Self Care | Payer: Self-pay | Source: Ambulatory Visit | Attending: Internal Medicine

## 2016-08-02 ENCOUNTER — Ambulatory Visit (HOSPITAL_COMMUNITY)
Admission: RE | Admit: 2016-08-02 | Discharge: 2016-08-02 | Disposition: A | Discharge status: Home / Self Care | Payer: 59 | Source: Ambulatory Visit | Attending: Internal Medicine | Admitting: Internal Medicine

## 2016-08-02 DIAGNOSIS — R109 Unspecified abdominal pain: Secondary | ICD-10-CM | POA: Diagnosis not present

## 2016-08-02 DIAGNOSIS — R7982 Elevated C-reactive protein (CRP): Secondary | ICD-10-CM | POA: Insufficient documentation

## 2016-08-02 DIAGNOSIS — R933 Abnormal findings on diagnostic imaging of other parts of digestive tract: Secondary | ICD-10-CM | POA: Diagnosis not present

## 2016-08-02 DIAGNOSIS — K5010 Crohn's disease of large intestine without complications: Secondary | ICD-10-CM | POA: Insufficient documentation

## 2016-08-02 HISTORY — PX: GIVENS CAPSULE STUDY: SHX5432

## 2016-08-02 SURGERY — IMAGING PROCEDURE, GI TRACT, INTRALUMINAL, VIA CAPSULE

## 2016-08-02 MED ORDER — SODIUM CHLORIDE 0.9 % IV SOLN: INTRAVENOUS | Status: DC

## 2016-08-03 ENCOUNTER — Encounter (HOSPITAL_COMMUNITY): Payer: Self-pay | Admitting: Internal Medicine

## 2016-08-05 NOTE — Op Note (Signed)
Small Bowel Givens Capsule Study Procedure date:  08/02/2016  Referring Provider: Odis Hollingshead PCP:  Dr. Nevada Crane, Edwinna Areola, MD  Indication for procedure:   Patient is 46 year old Caucasian female with recurrent episodes of abdominal pain who was found to have small bowel wall thickening concerning for Crohn's disease. Lab studies pertinent for elevated CRP. She is undergoing given capsule study to further evaluate her symptoms and small bowel wall thickening. Small patency patency was documented with Agile capsule.    Findings:   Patient was able to swallow given capsule without any difficulty. No mucosal abnormality noted to gastric or small bowel mucosa.      First Gastric image:  31 sec First Duodenal image: 9 min and 27 sec First Ileo-Cecal Valve image: 39 min and 59 sec First Cecal image: 40 min and 5 sec Gastric Passage time: 9 min Small Bowel Passage time: 30 min  Summary & Recommendations:  Rapid small bowel transit of given capsule no changes noted to suggest Crohn's disease. Jejunal abnormality seen on 2 prior CTs most likely due to infection or other causes. Will schedule abdominopelvic CT to document the small bowel wall thickening has resolved. CRP.

## 2016-08-06 ENCOUNTER — Other Ambulatory Visit (INDEPENDENT_AMBULATORY_CARE_PROVIDER_SITE_OTHER): Payer: Self-pay | Admitting: Internal Medicine

## 2016-08-06 DIAGNOSIS — R109 Unspecified abdominal pain: Secondary | ICD-10-CM

## 2016-08-06 DIAGNOSIS — K639 Disease of intestine, unspecified: Secondary | ICD-10-CM

## 2016-08-16 ENCOUNTER — Ambulatory Visit (HOSPITAL_COMMUNITY)
Admission: RE | Admit: 2016-08-16 | Discharge: 2016-08-16 | Disposition: A | Discharge status: Home / Self Care | Payer: 59 | Source: Ambulatory Visit | Attending: Internal Medicine | Admitting: Internal Medicine

## 2016-08-16 DIAGNOSIS — K639 Disease of intestine, unspecified: Secondary | ICD-10-CM

## 2016-08-16 DIAGNOSIS — R109 Unspecified abdominal pain: Secondary | ICD-10-CM

## 2016-08-17 ENCOUNTER — Ambulatory Visit (HOSPITAL_COMMUNITY)
Admission: RE | Admit: 2016-08-17 | Discharge: 2016-08-17 | Disposition: A | Discharge status: Home / Self Care | Payer: 59 | Source: Ambulatory Visit | Attending: Internal Medicine | Admitting: Internal Medicine

## 2016-08-17 ENCOUNTER — Encounter (HOSPITAL_COMMUNITY): Payer: Self-pay

## 2016-08-17 DIAGNOSIS — R109 Unspecified abdominal pain: Secondary | ICD-10-CM | POA: Insufficient documentation

## 2016-08-17 DIAGNOSIS — K639 Disease of intestine, unspecified: Secondary | ICD-10-CM | POA: Diagnosis not present

## 2016-08-17 MED ORDER — IOPAMIDOL (ISOVUE-300) INJECTION 61%
100.0000 mL | Freq: Once | INTRAVENOUS | Status: AC | PRN
  Administered 2016-08-17: 100 mL via INTRAVENOUS

## 2016-08-23 ENCOUNTER — Other Ambulatory Visit (INDEPENDENT_AMBULATORY_CARE_PROVIDER_SITE_OTHER): Payer: Self-pay | Admitting: *Deleted

## 2016-08-23 DIAGNOSIS — K5010 Crohn's disease of large intestine without complications: Secondary | ICD-10-CM

## 2016-09-06 DIAGNOSIS — Z6827 Body mass index (BMI) 27.0-27.9, adult: Secondary | ICD-10-CM | POA: Diagnosis not present

## 2016-09-06 DIAGNOSIS — M25562 Pain in left knee: Secondary | ICD-10-CM | POA: Diagnosis not present

## 2016-09-16 DIAGNOSIS — M25562 Pain in left knee: Secondary | ICD-10-CM | POA: Diagnosis not present

## 2016-09-21 DIAGNOSIS — E782 Mixed hyperlipidemia: Secondary | ICD-10-CM | POA: Diagnosis not present

## 2016-09-24 DIAGNOSIS — M25562 Pain in left knee: Secondary | ICD-10-CM | POA: Diagnosis not present

## 2016-10-05 DIAGNOSIS — M25562 Pain in left knee: Secondary | ICD-10-CM | POA: Diagnosis not present

## 2016-10-14 DIAGNOSIS — Z01419 Encounter for gynecological examination (general) (routine) without abnormal findings: Secondary | ICD-10-CM | POA: Diagnosis not present

## 2016-10-14 DIAGNOSIS — Z1231 Encounter for screening mammogram for malignant neoplasm of breast: Secondary | ICD-10-CM | POA: Diagnosis not present

## 2016-10-18 ENCOUNTER — Other Ambulatory Visit: Payer: Self-pay | Admitting: Obstetrics & Gynecology

## 2016-10-18 DIAGNOSIS — R928 Other abnormal and inconclusive findings on diagnostic imaging of breast: Secondary | ICD-10-CM

## 2016-10-21 ENCOUNTER — Ambulatory Visit
Admission: RE | Admit: 2016-10-21 | Discharge: 2016-10-21 | Disposition: A | Discharge status: Home / Self Care | Payer: 59 | Source: Ambulatory Visit | Attending: Obstetrics & Gynecology | Admitting: Obstetrics & Gynecology

## 2016-10-21 ENCOUNTER — Other Ambulatory Visit: Payer: Self-pay | Admitting: Obstetrics & Gynecology

## 2016-10-21 DIAGNOSIS — R928 Other abnormal and inconclusive findings on diagnostic imaging of breast: Secondary | ICD-10-CM

## 2016-10-21 DIAGNOSIS — R922 Inconclusive mammogram: Secondary | ICD-10-CM | POA: Diagnosis not present

## 2016-10-21 DIAGNOSIS — N6322 Unspecified lump in the left breast, upper inner quadrant: Secondary | ICD-10-CM | POA: Diagnosis not present

## 2016-10-27 MED FILL — DULoxetine HCL 60 MG CPEP: 60 | 30 days supply | Qty: 30 | Fill #0

## 2016-12-23 DIAGNOSIS — F330 Major depressive disorder, recurrent, mild: Secondary | ICD-10-CM | POA: Diagnosis not present

## 2016-12-23 DIAGNOSIS — E782 Mixed hyperlipidemia: Secondary | ICD-10-CM | POA: Diagnosis not present

## 2016-12-23 MED FILL — DULoxetine HCL 60 MG CPEP: 60 | 90 days supply | Qty: 90 | Fill #0

## 2016-12-29 MED FILL — FENOFIBRATE 160 MG TABLET: 160 | 30 days supply | Qty: 30 | Fill #0

## 2017-06-01 ENCOUNTER — Ambulatory Visit (HOSPITAL_COMMUNITY)
Admission: EM | Admit: 2017-06-01 | Discharge: 2017-06-01 | Disposition: A | Discharge status: Home / Self Care | Payer: No Typology Code available for payment source | Attending: Family Medicine | Admitting: Family Medicine

## 2017-06-01 ENCOUNTER — Other Ambulatory Visit: Payer: Self-pay

## 2017-06-01 ENCOUNTER — Encounter (HOSPITAL_COMMUNITY): Payer: Self-pay | Admitting: Emergency Medicine

## 2017-06-01 DIAGNOSIS — B349 Viral infection, unspecified: Secondary | ICD-10-CM | POA: Diagnosis not present

## 2017-06-01 DIAGNOSIS — R0981 Nasal congestion: Secondary | ICD-10-CM | POA: Diagnosis not present

## 2017-06-01 DIAGNOSIS — J029 Acute pharyngitis, unspecified: Secondary | ICD-10-CM | POA: Diagnosis not present

## 2017-06-01 DIAGNOSIS — R05 Cough: Secondary | ICD-10-CM

## 2017-06-01 MED ORDER — IPRATROPIUM BROMIDE 0.06 % NA SOLN: 2.0000 | Freq: Four times a day (QID) | NASAL | 0 refills | Status: DC

## 2017-06-01 MED ORDER — BENZONATATE 100 MG PO CAPS: 100.0000 mg | ORAL_CAPSULE | Freq: Three times a day (TID) | ORAL | 0 refills | Status: DC

## 2017-06-01 MED ORDER — IPRATROPIUM-ALBUTEROL 0.5-2.5 (3) MG/3ML IN SOLN
3.0000 mL | Freq: Once | RESPIRATORY_TRACT | Status: AC
  Administered 2017-06-01: 3 mL via RESPIRATORY_TRACT

## 2017-06-01 MED ORDER — ALBUTEROL SULFATE HFA 108 (90 BASE) MCG/ACT IN AERS: 1.0000 | INHALATION_SPRAY | Freq: Four times a day (QID) | RESPIRATORY_TRACT | 0 refills | Status: DC | PRN

## 2017-06-01 MED ORDER — IPRATROPIUM-ALBUTEROL 0.5-2.5 (3) MG/3ML IN SOLN
RESPIRATORY_TRACT | Status: AC
  Filled 2017-06-01: qty 3

## 2017-06-01 MED ORDER — PREDNISONE 50 MG PO TABS: 50.0000 mg | ORAL_TABLET | Freq: Every day | ORAL | 0 refills | Status: AC

## 2017-06-01 MED ORDER — TRIAMCINOLONE ACETONIDE 55 MCG/ACT NA AERO: 2.0000 | INHALATION_SPRAY | Freq: Every day | NASAL | 12 refills | Status: DC

## 2017-06-01 MED FILL — IPRATROPIUM 0.06% SPRAY: 0.06 | 10 days supply | Qty: 15 | Fill #0

## 2017-06-01 MED FILL — predniSONE 50 MG TABS: 50 | 4 days supply | Qty: 4 | Fill #0

## 2017-06-01 MED FILL — BENZONATATE 100 MG CAPS: 100 | 7 days supply | Qty: 21 | Fill #0

## 2017-06-01 MED FILL — VENTOLIN HFA 90 MCG INHALER: 108 (90 BAS | 25 days supply | Qty: 18 | Fill #0

## 2017-06-01 NOTE — ED Triage Notes (Signed)
Onset of symptoms Sunday night.  Patient has had cough, fever, sob, fever has been 101.

## 2017-06-01 NOTE — Discharge Instructions (Signed)
Tessalon for cough. Prednisone as directed. Albuterol as needed for wheezing/shortness of breath. Start nasacort, atrovent nasal spray for nasal congestion/drainage. You can use over the counter nasal saline rinse such as neti pot for nasal congestion. Keep hydrated, your urine should be clear to pale yellow in color. Tylenol/motrin for fever and pain. Monitor for any worsening of symptoms, chest pain, shortness of breath, wheezing, swelling of the throat, follow up for reevaluation.   For sore throat try using a honey-based tea. Use 3 teaspoons of honey with juice squeezed from half lemon. Place shaved pieces of ginger into 1/2-1 cup of water and warm over stove top. Then mix the ingredients and repeat every 4 hours as needed.

## 2017-06-01 NOTE — ED Provider Notes (Signed)
[place]    CSN: [id number] Arrival date & time: 06/01/17  2440     History   Chief Complaint Chief Complaint  Patient presents with  . URI    HPI Kathleen Price is a 47 y.o. female.   47 year old female with history of Crohn's disease comes in for 4-day history of URI symptoms.  She has had productive cough, rhinorrhea, nasal congestion, sore throat.  Fever, T-max 101,  last dose of antipyretics 2 hours ago.  States she has felt short of breath at rest, worse with movement.  Denies chest pain.  Has had wheezing that clears after cough.  She has also felt lightheaded.  She has not been able to eat, but has  continued to drink without problems.  OTC NyQuil/DayQuil without relief.  Current everyday smoker, 12.5-pack-year history.     Past Medical History:  Diagnosis Date  . Anxiety   . Headache(784.0)   . IBS (irritable bowel syndrome)     Patient Active Problem List   Diagnosis Date Noted  . Crohn's disease of colon without complication ([place]) 11/14/2534    Past Surgical History:  Procedure Laterality Date  . ABLATION    . AGILE CAPSULE N/A 07/26/2016   Procedure: AGILE CAPSULE;  Surgeon: Rogene Houston, MD;  Location: [facility];  Service: Endoscopy;  Laterality: N/A;  . CESAREAN SECTION    . CHOLECYSTECTOMY    . GIVENS CAPSULE STUDY N/A 08/02/2016   Procedure: GIVENS CAPSULE STUDY;  Surgeon: Rogene Houston, MD;  Location: [facility];  Service: Endoscopy;  Laterality: N/A;  . TUBAL LIGATION      OB History    Gravida  3   Para  3   Term  3   Preterm      AB      Living  3     SAB      TAB      Ectopic      Multiple      Live Births               Home Medications    Prior to Admission medications   Medication Sig Start Date End Date Taking? Authorizing Provider  Pseudoeph-Doxylamine-DM-APAP (NYQUIL PO) Take by mouth.   Yes [provider]  Pseudoephedrine-APAP-DM (DAYQUIL PO) Take by mouth.   Yes  [provider]  albuterol (PROVENTIL HFA;VENTOLIN HFA) 108 (90 Base) MCG/ACT inhaler Inhale 1-2 puffs into the lungs every 6 (six) hours as needed for wheezing or shortness of breath. 06/01/17   Tasia Catchings, Juliani Laduke V, PA-C  benzonatate (TESSALON) 100 MG capsule Take 1 capsule (100 mg total) by mouth every 8 (eight) hours. 06/01/17   Tasia Catchings, Maybell Misenheimer V, PA-C  ciprofloxacin (CIPRO) 500 MG tablet Take 1 tablet (500 mg total) by mouth 2 (two) times daily. 06/08/16   Setzer, Rona Ravens, NP  DULoxetine (CYMBALTA) 60 MG capsule Take 60 mg by mouth daily.    [provider]  ibuprofen (ADVIL,MOTRIN) 200 MG tablet Take 800 mg by mouth every 6 (six) hours as needed for pain.    [provider]  ipratropium (ATROVENT) 0.06 % nasal spray Place 2 sprays into both nostrils 4 (four) times daily. 06/01/17   Tasia Catchings, Onica Davidovich V, PA-C  metroNIDAZOLE (FLAGYL) 250 MG tablet Take 1 tablet (250 mg total) by mouth 3 (three) times daily. Patient not taking: Reported on 07/06/2016 06/08/16   Butch Penny, NP  omeprazole (PRILOSEC OTC) 20 MG tablet Take  20 mg by mouth daily as needed (acid reflux/upset stomach).    [provider]  ondansetron (ZOFRAN) 4 MG tablet Take 1 tablet (4 mg total) by mouth every 6 (six) hours. 06/07/16   Triplett, Tammy, PA-C  oxyCODONE-acetaminophen (PERCOCET/ROXICET) 5-325 MG tablet Take 1 tablet by mouth every 4 (four) hours as needed. 06/07/16   Triplett, Tammy, PA-C  predniSONE (DELTASONE) 50 MG tablet Take 1 tablet (50 mg total) by mouth daily for 4 days. 06/01/17 06/05/17  Tasia Catchings, Raylon Lamson V, PA-C  triamcinolone (NASACORT) 55 MCG/ACT AERO nasal inhaler Place 2 sprays into the nose daily. 06/01/17   Ok Edwards, PA-C    Family History History reviewed. No pertinent family history.  Social History Social History   Tobacco Use  . Smoking status: Current Every Day Smoker    Packs/day: 0.50    Years: 25.00    Pack years: 12.50    Types: Cigarettes  . Smokeless tobacco: Never Used  Substance Use  Topics  . Alcohol use: No  . Drug use: No     Allergies   Morphine and related; Tuberulin old human [old tuberculin]; and Wellbutrin [bupropion hcl]   Review of Systems Review of Systems  Reason unable to perform ROS: See HPI as above.     Physical Exam Triage Vital Signs ED Triage Vitals  Enc Vitals Group     BP 06/01/17 1009 125/86     Pulse Rate 06/01/17 1009 85     Resp 06/01/17 1009 18     Temp 06/01/17 1009 (!) 97.5 F (36.4 C)     Temp Source 06/01/17 1009 Oral     SpO2 06/01/17 1009 99 %     Weight --      Height --      Head Circumference --      Peak Flow --      Pain Score 06/01/17 1007 7     Pain Loc --      Pain Edu? --      Excl. in [place]? --    No data found.  Updated Vital Signs BP 125/86 (BP Location: Right Arm)   Pulse 85   Temp (!) 97.5 F (36.4 C) (Oral)   Resp 18   SpO2 99%   Physical Exam  Constitutional: She is oriented to person, place, and time. She appears well-developed and well-nourished. No distress.  HENT:  Head: Normocephalic and atraumatic.  Right Ear: Tympanic membrane, external ear and ear canal normal. Tympanic membrane is not erythematous and not bulging.  Left Ear: Tympanic membrane, external ear and ear canal normal. Tympanic membrane is not erythematous and not bulging.  Nose: Rhinorrhea present. Right sinus exhibits no maxillary sinus tenderness and no frontal sinus tenderness. Left sinus exhibits no maxillary sinus tenderness and no frontal sinus tenderness.  Mouth/Throat: Uvula is midline, oropharynx is clear and moist and mucous membranes are normal. No tonsillar exudate.  Eyes: Pupils are equal, round, and reactive to light. Conjunctivae are normal.  Neck: Normal range of motion. Neck supple.  Cardiovascular: Normal rate, regular rhythm and normal heart sounds. Exam reveals no gallop and no friction rub.  No murmur heard. Pulmonary/Chest: Effort normal and breath sounds normal. No accessory muscle usage or stridor. No  respiratory distress. She has no decreased breath sounds. She has no wheezes. She has no rhonchi. She has no rales.  Patient speaking in full sentences without problems.  Lymphadenopathy:    She has no cervical adenopathy.  Neurological: She is  alert and oriented to person, place, and time.  Skin: Skin is warm and dry.  Psychiatric: She has a normal mood and affect. Her behavior is normal. Judgment normal.     UC Treatments / Results  Labs (all labs ordered are listed, but only abnormal results are displayed) Labs Reviewed - No data to display  EKG None  Radiology No results found.  Procedures Procedures (including critical care time)  Medications Ordered in UC Medications  ipratropium-albuterol (DUONEB) 0.5-2.5 (3) MG/3ML nebulizer solution 3 mL (3 mLs Nebulization Given 06/01/17 1035)    Initial Impression / Assessment and Plan / UC Course  I have reviewed the triage vital signs and the nursing notes.  Pertinent labs & imaging results that were available during my care of the patient were reviewed by me and considered in my medical decision making (see chart for details).    Patient with some improvement of shortness of breath after DuoNeb.  Will provide albuterol inhaler as needed.  Prednisone as directed.  Other symptomatic treatment discussed.  Return precautions given.  Patient expresses understanding and agrees to plan.  Final Clinical Impressions(s) / UC Diagnoses   Final diagnoses:  Viral syndrome    ED Prescriptions    Medication Sig Dispense Auth. Provider   benzonatate (TESSALON) 100 MG capsule Take 1 capsule (100 mg total) by mouth every 8 (eight) hours. 21 capsule Leilynn Pilat V, PA-C   ipratropium (ATROVENT) 0.06 % nasal spray Place 2 sprays into both nostrils 4 (four) times daily. 15 mL Ashani Pumphrey V, PA-C   triamcinolone (NASACORT) 55 MCG/ACT AERO nasal inhaler Place 2 sprays into the nose daily. 1 Inhaler Mellanie Bejarano V, PA-C   predniSONE (DELTASONE) 50 MG tablet  Take 1 tablet (50 mg total) by mouth daily for 4 days. 4 tablet Jensine Luz V, PA-C   albuterol (PROVENTIL HFA;VENTOLIN HFA) 108 (90 Base) MCG/ACT inhaler Inhale 1-2 puffs into the lungs every 6 (six) hours as needed for wheezing or shortness of breath. 1 Inhaler Tobin Chad, PA-C 06/01/17 1049

## 2017-06-20 MED FILL — DULoxetine HCL 60 MG CPEP: 60 | 90 days supply | Qty: 90 | Fill #1

## 2017-11-25 MED FILL — DULoxetine HCL 60 MG CPEP: 60 | 90 days supply | Qty: 90 | Fill #0

## 2018-03-17 MED FILL — DULoxetine HCL 60 MG CPEP: 60 | 90 days supply | Qty: 90 | Fill #1

## 2018-06-23 DIAGNOSIS — M71571 Other bursitis, not elsewhere classified, right ankle and foot: Secondary | ICD-10-CM | POA: Diagnosis not present

## 2018-06-23 DIAGNOSIS — M19071 Primary osteoarthritis, right ankle and foot: Secondary | ICD-10-CM | POA: Diagnosis not present

## 2018-06-23 DIAGNOSIS — M19072 Primary osteoarthritis, left ankle and foot: Secondary | ICD-10-CM | POA: Diagnosis not present

## 2018-06-23 DIAGNOSIS — M71572 Other bursitis, not elsewhere classified, left ankle and foot: Secondary | ICD-10-CM | POA: Diagnosis not present

## 2018-06-23 DIAGNOSIS — M7672 Peroneal tendinitis, left leg: Secondary | ICD-10-CM | POA: Diagnosis not present

## 2018-06-23 MED FILL — MELOXICAM 7.5 MG TABLET: 7.5 | 30 days supply | Qty: 30 | Fill #0

## 2018-07-12 MED FILL — DULoxetine HCL 60 MG CPEP: 60 | 90 days supply | Qty: 90 | Fill #0

## 2018-09-12 DIAGNOSIS — Z0000 Encounter for general adult medical examination without abnormal findings: Secondary | ICD-10-CM | POA: Diagnosis not present

## 2018-09-20 DIAGNOSIS — E782 Mixed hyperlipidemia: Secondary | ICD-10-CM | POA: Diagnosis not present

## 2018-09-20 DIAGNOSIS — M25571 Pain in right ankle and joints of right foot: Secondary | ICD-10-CM | POA: Diagnosis not present

## 2018-09-20 DIAGNOSIS — R7301 Impaired fasting glucose: Secondary | ICD-10-CM | POA: Diagnosis not present

## 2018-09-20 DIAGNOSIS — M7918 Myalgia, other site: Secondary | ICD-10-CM | POA: Diagnosis not present

## 2018-09-20 DIAGNOSIS — M25532 Pain in left wrist: Secondary | ICD-10-CM | POA: Diagnosis not present

## 2018-09-20 DIAGNOSIS — F330 Major depressive disorder, recurrent, mild: Secondary | ICD-10-CM | POA: Diagnosis not present

## 2018-09-20 DIAGNOSIS — M25572 Pain in left ankle and joints of left foot: Secondary | ICD-10-CM | POA: Diagnosis not present

## 2018-09-20 DIAGNOSIS — M62838 Other muscle spasm: Secondary | ICD-10-CM | POA: Diagnosis not present

## 2018-09-20 DIAGNOSIS — M25531 Pain in right wrist: Secondary | ICD-10-CM | POA: Diagnosis not present

## 2018-09-20 DIAGNOSIS — F331 Major depressive disorder, recurrent, moderate: Secondary | ICD-10-CM | POA: Diagnosis not present

## 2018-09-20 DIAGNOSIS — M542 Cervicalgia: Secondary | ICD-10-CM | POA: Diagnosis not present

## 2018-09-20 MED FILL — ATORVASTATIN 10 MG TABLET: 10 | 30 days supply | Qty: 30 | Fill #0

## 2018-10-19 ENCOUNTER — Other Ambulatory Visit: Payer: Self-pay

## 2018-10-19 ENCOUNTER — Ambulatory Visit (INDEPENDENT_AMBULATORY_CARE_PROVIDER_SITE_OTHER): Payer: 59 | Admitting: Podiatry

## 2018-10-19 ENCOUNTER — Ambulatory Visit (INDEPENDENT_AMBULATORY_CARE_PROVIDER_SITE_OTHER): Payer: 59

## 2018-10-19 DIAGNOSIS — M7661 Achilles tendinitis, right leg: Secondary | ICD-10-CM

## 2018-10-19 DIAGNOSIS — Z1231 Encounter for screening mammogram for malignant neoplasm of breast: Secondary | ICD-10-CM | POA: Diagnosis not present

## 2018-10-19 DIAGNOSIS — Z01419 Encounter for gynecological examination (general) (routine) without abnormal findings: Secondary | ICD-10-CM | POA: Diagnosis not present

## 2018-10-19 DIAGNOSIS — Z124 Encounter for screening for malignant neoplasm of cervix: Secondary | ICD-10-CM | POA: Diagnosis not present

## 2018-10-19 DIAGNOSIS — L600 Ingrowing nail: Secondary | ICD-10-CM

## 2018-10-19 DIAGNOSIS — M7662 Achilles tendinitis, left leg: Secondary | ICD-10-CM

## 2018-10-19 DIAGNOSIS — L603 Nail dystrophy: Secondary | ICD-10-CM | POA: Diagnosis not present

## 2018-10-19 DIAGNOSIS — M79676 Pain in unspecified toe(s): Secondary | ICD-10-CM | POA: Diagnosis not present

## 2018-10-19 DIAGNOSIS — M722 Plantar fascial fibromatosis: Secondary | ICD-10-CM | POA: Diagnosis not present

## 2018-10-19 DIAGNOSIS — M79671 Pain in right foot: Secondary | ICD-10-CM

## 2018-10-19 DIAGNOSIS — M79672 Pain in left foot: Secondary | ICD-10-CM

## 2018-10-19 DIAGNOSIS — R232 Flushing: Secondary | ICD-10-CM | POA: Insufficient documentation

## 2018-10-19 NOTE — Patient Instructions (Signed)
Soak Instructions    THE DAY AFTER THE PROCEDURE  Place 1/4 cup of epsom salts in a quart of warm tap water.  Submerge your foot or feet with outer bandage intact for the initial soak; this will allow the bandage to become moist and wet for easy lift off.  Once you remove your bandage, continue to soak in the solution for 20 minutes.  This soak should be done twice a day.  Next, remove your foot or feet from solution, blot dry the affected area and cover.  You may use a band aid large enough to cover the area or use gauze and tape.  Apply other medications to the area as directed by the doctor such as polysporin neosporin.  IF YOUR SKIN BECOMES IRRITATED WHILE USING THESE INSTRUCTIONS, IT IS OKAY TO SWITCH TO  WHITE VINEGAR AND WATER. Or you may use antibacterial soap and water to keep the toe clean  Monitor for any signs/symptoms of infection. Call the office immediately if any occur or go directly to the emergency room. Call with any questions/concerns.    Long Term Care Instructions-Post Nail Surgery  You have had your ingrown toenail and root treated with a chemical.  This chemical causes a burn that will drain and ooze like a blister.  This can drain for 6-8 weeks or longer.  It is important to keep this area clean, covered, and follow the soaking instructions dispensed at the time of your surgery.  This area will eventually dry and form a scab.  Once the scab forms you no longer need to soak or apply a dressing.  If at any time you experience an increase in pain, redness, swelling, or drainage, you should contact the office as soon as possible.  Plantar Fasciitis (Heel Spur Syndrome) with Rehab The plantar fascia is a fibrous, ligament-like, soft-tissue structure that spans the bottom of the foot. Plantar fasciitis is a condition that causes pain in the foot due to inflammation of the tissue. SYMPTOMS   Pain and tenderness on the underneath side of the foot.  Pain that worsens with  standing or walking. CAUSES  Plantar fasciitis is caused by irritation and injury to the plantar fascia on the underneath side of the foot. Common mechanisms of injury include:  Direct trauma to bottom of the foot.  Damage to a small nerve that runs under the foot where the main fascia attaches to the heel bone.  Stress placed on the plantar fascia due to bone spurs. RISK INCREASES WITH:   Activities that place stress on the plantar fascia (running, jumping, pivoting, or cutting).  Poor strength and flexibility.  Improperly fitted shoes.  Tight calf muscles.  Flat feet.  Failure to warm-up properly before activity.  Obesity. PREVENTION  Warm up and stretch properly before activity.  Allow for adequate recovery between workouts.  Maintain physical fitness:  Strength, flexibility, and endurance.  Cardiovascular fitness.  Maintain a health body weight.  Avoid stress on the plantar fascia.  Wear properly fitted shoes, including arch supports for individuals who have flat feet.  PROGNOSIS  If treated properly, then the symptoms of plantar fasciitis usually resolve without surgery. However, occasionally surgery is necessary.  RELATED COMPLICATIONS   Recurrent symptoms that may result in a chronic condition.  Problems of the lower back that are caused by compensating for the injury, such as limping.  Pain or weakness of the foot during push-off following surgery.  Chronic inflammation, scarring, and partial or complete fascia tear,   occurring more often from repeated injections.  TREATMENT  Treatment initially involves the use of ice and medication to help reduce pain and inflammation. The use of strengthening and stretching exercises may help reduce pain with activity, especially stretches of the Achilles tendon. These exercises may be performed at home or with a therapist. Your caregiver may recommend that you use heel cups of arch supports to help reduce stress on  the plantar fascia. Occasionally, corticosteroid injections are given to reduce inflammation. If symptoms persist for greater than 6 months despite non-surgical (conservative), then surgery may be recommended.   MEDICATION   If pain medication is necessary, then nonsteroidal anti-inflammatory medications, such as aspirin and ibuprofen, or other minor pain relievers, such as acetaminophen, are often recommended.  Do not take pain medication within 7 days before surgery.  Prescription pain relievers may be given if deemed necessary by your caregiver. Use only as directed and only as much as you need.  Corticosteroid injections may be given by your caregiver. These injections should be reserved for the most serious cases, because they may only be given a certain number of times.  HEAT AND COLD  Cold treatment (icing) relieves pain and reduces inflammation. Cold treatment should be applied for 10 to 15 minutes every 2 to 3 hours for inflammation and pain and immediately after any activity that aggravates your symptoms. Use ice packs or massage the area with a piece of ice (ice massage).  Heat treatment may be used prior to performing the stretching and strengthening activities prescribed by your caregiver, physical therapist, or athletic trainer. Use a heat pack or soak the injury in warm water.  SEEK IMMEDIATE MEDICAL CARE IF:  Treatment seems to offer no benefit, or the condition worsens.  Any medications produce adverse side effects.  EXERCISES- RANGE OF MOTION (ROM) AND STRETCHING EXERCISES - Plantar Fasciitis (Heel Spur Syndrome) These exercises may help you when beginning to rehabilitate your injury. Your symptoms may resolve with or without further involvement from your physician, physical therapist or athletic trainer. While completing these exercises, remember:   Restoring tissue flexibility helps normal motion to return to the joints. This allows healthier, less painful movement and  activity.  An effective stretch should be held for at least 30 seconds.  A stretch should never be painful. You should only feel a gentle lengthening or release in the stretched tissue.  RANGE OF MOTION - Toe Extension, Flexion  Sit with your right / left leg crossed over your opposite knee.  Grasp your toes and gently pull them back toward the top of your foot. You should feel a stretch on the bottom of your toes and/or foot.  Hold this stretch for 10 seconds.  Now, gently pull your toes toward the bottom of your foot. You should feel a stretch on the top of your toes and or foot.  Hold this stretch for 10 seconds. Repeat  times. Complete this stretch 3 times per day.   RANGE OF MOTION - Ankle Dorsiflexion, Active Assisted  Remove shoes and sit on a chair that is preferably not on a carpeted surface.  Place right / left foot under knee. Extend your opposite leg for support.  Keeping your heel down, slide your right / left foot back toward the chair until you feel a stretch at your ankle or calf. If you do not feel a stretch, slide your bottom forward to the edge of the chair, while still keeping your heel down.  Hold this stretch   for 10 seconds. Repeat 3 times. Complete this stretch 2 times per day.   STRETCH  Gastroc, Standing  Place hands on wall.  Extend right / left leg, keeping the front knee somewhat bent.  Slightly point your toes inward on your back foot.  Keeping your right / left heel on the floor and your knee straight, shift your weight toward the wall, not allowing your back to arch.  You should feel a gentle stretch in the right / left calf. Hold this position for 10 seconds. Repeat 3 times. Complete this stretch 2 times per day.  STRETCH  Soleus, Standing  Place hands on wall.  Extend right / left leg, keeping the other knee somewhat bent.  Slightly point your toes inward on your back foot.  Keep your right / left heel on the floor, bend your back  knee, and slightly shift your weight over the back leg so that you feel a gentle stretch deep in your back calf.  Hold this position for 10 seconds. Repeat 3 times. Complete this stretch 2 times per day.  STRETCH  Gastrocsoleus, Standing  Note: This exercise can place a lot of stress on your foot and ankle. Please complete this exercise only if specifically instructed by your caregiver.   Place the ball of your right / left foot on a step, keeping your other foot firmly on the same step.  Hold on to the wall or a rail for balance.  Slowly lift your other foot, allowing your body weight to press your heel down over the edge of the step.  You should feel a stretch in your right / left calf.  Hold this position for 10 seconds.  Repeat this exercise with a slight bend in your right / left knee. Repeat 3 times. Complete this stretch 2 times per day.   STRENGTHENING EXERCISES - Plantar Fasciitis (Heel Spur Syndrome)  These exercises may help you when beginning to rehabilitate your injury. They may resolve your symptoms with or without further involvement from your physician, physical therapist or athletic trainer. While completing these exercises, remember:   Muscles can gain both the endurance and the strength needed for everyday activities through controlled exercises.  Complete these exercises as instructed by your physician, physical therapist or athletic trainer. Progress the resistance and repetitions only as guided.  STRENGTH - Towel Curls  Sit in a chair positioned on a non-carpeted surface.  Place your foot on a towel, keeping your heel on the floor.  Pull the towel toward your heel by only curling your toes. Keep your heel on the floor. Repeat 3 times. Complete this exercise 2 times per day.  STRENGTH - Ankle Inversion  Secure one end of a rubber exercise band/tubing to a fixed object (table, pole). Loop the other end around your foot just before your toes.  Place your  fists between your knees. This will focus your strengthening at your ankle.  Slowly, pull your big toe up and in, making sure the band/tubing is positioned to resist the entire motion.  Hold this position for 10 seconds.  Have your muscles resist the band/tubing as it slowly pulls your foot back to the starting position. Repeat 3 times. Complete this exercises 2 times per day.  Document Released: 01/04/2005 Document Revised: 03/29/2011 Document Reviewed: 04/18/2008 [employer] Patient Information 2014 [employer]. 

## 2018-10-19 NOTE — Progress Notes (Signed)
Subjective:  Patient ID: Kathleen Price, female    DOB: [birth year],  MRN: [id number]  Chief Complaint  Patient presents with  . Foot Pain    Pt states bilateral plantar/posterior heel pain, 2 year duration, no known injuries.  . Foot Pain    Pt states bilateral plantar midfoot pain, 2 year duration, no known injuries.    48 y.o. female presents with the above complaint. Hx as above. Also complains of ingrown nail to the right great toe and nail discoloration and thickening.  Review of Systems: Negative except as noted in the HPI. Denies N/V/F/Ch.  Past Medical History:  Diagnosis Date  . Anxiety   . Headache(784.0)   . IBS (irritable bowel syndrome)     Current Outpatient Medications:  .  DULoxetine (CYMBALTA) 60 MG capsule, Take 60 mg by mouth daily., Disp: , Rfl:  .  albuterol (PROVENTIL HFA;VENTOLIN HFA) 108 (90 Base) MCG/ACT inhaler, Inhale 1-2 puffs into the lungs every 6 (six) hours as needed for wheezing or shortness of breath. (Patient not taking: Reported on 10/19/2018), Disp: 1 Inhaler, Rfl: 0 .  atorvastatin (LIPITOR) 10 MG tablet, atorvastatin 10 mg tablet, Disp: , Rfl:  .  benzonatate (TESSALON) 100 MG capsule, Take 1 capsule (100 mg total) by mouth every 8 (eight) hours. (Patient not taking: Reported on 10/19/2018), Disp: 21 capsule, Rfl: 0 .  ciprofloxacin (CIPRO) 500 MG tablet, Take 1 tablet (500 mg total) by mouth 2 (two) times daily. (Patient not taking: Reported on 10/19/2018), Disp: 28 tablet, Rfl: 0 .  ibuprofen (ADVIL,MOTRIN) 200 MG tablet, Take 800 mg by mouth every 6 (six) hours as needed for pain., Disp: , Rfl:  .  ipratropium (ATROVENT) 0.06 % nasal spray, Place 2 sprays into both nostrils 4 (four) times daily. (Patient not taking: Reported on 10/19/2018), Disp: 15 mL, Rfl: 0 .  metroNIDAZOLE (FLAGYL) 250 MG tablet, Take 1 tablet (250 mg total) by mouth 3 (three) times daily. (Patient not taking: Reported on 07/06/2016), Disp: 42 tablet, Rfl: 0 .  omeprazole  (PRILOSEC OTC) 20 MG tablet, Take 20 mg by mouth daily as needed (acid reflux/upset stomach)., Disp: , Rfl:  .  ondansetron (ZOFRAN) 4 MG tablet, Take 1 tablet (4 mg total) by mouth every 6 (six) hours. (Patient not taking: Reported on 10/19/2018), Disp: 12 tablet, Rfl: 0 .  oxyCODONE-acetaminophen (PERCOCET/ROXICET) 5-325 MG tablet, Take 1 tablet by mouth every 4 (four) hours as needed. (Patient not taking: Reported on 10/19/2018), Disp: 20 tablet, Rfl: 0 .  Pseudoeph-Doxylamine-DM-APAP (NYQUIL PO), Take by mouth., Disp: , Rfl:  .  Pseudoephedrine-APAP-DM (DAYQUIL PO), Take by mouth., Disp: , Rfl:  .  triamcinolone (NASACORT) 55 MCG/ACT AERO nasal inhaler, Place 2 sprays into the nose daily. (Patient not taking: Reported on 10/19/2018), Disp: 1 Inhaler, Rfl: 12  Social History   Tobacco Use  Smoking Status Current Every Day Smoker  . Packs/day: 0.50  . Years: 25.00  . Pack years: 12.50  . Types: Cigarettes  Smokeless Tobacco Never Used    Allergies  Allergen Reactions  . Morphine And Related   . Sulfa Antibiotics Nausea Only  . Tuberulin Old Human [Old Tuberculin]     Severe allergic skin reaction  . Wellbutrin [Bupropion Hcl] Hives   Objective:  There were no vitals filed for this visit. There is no height or weight on file to calculate BMI. Constitutional Well developed. Well nourished.  Vascular Dorsalis pedis pulses palpable bilaterally. Posterior tibial pulses palpable bilaterally. Capillary refill normal  to all digits.  No cyanosis or clubbing noted. Pedal hair growth normal.  Neurologic Normal speech. Oriented to person, place, and time. Epicritic sensation to light touch grossly present bilaterally.  Dermatologic Painful ingrowing nail at medial nail borders of the hallux nail right. No other open wounds. No skin lesions.  Orthopedic: Normal joint ROM without pain or crepitus bilaterally. No visible deformities. POP medial calc tuber bilat   Radiographs: None  Assessment:   1. Plantar fasciitis   2. Achilles tendinitis of both lower extremities   3. Ingrown nail   4. Pain around toenail   5. Nail dystrophy    Plan:  Patient was evaluated and treated and all questions answered.  Ingrown Nail, right -Patient elects to proceed with minor surgery to remove ingrown toenail removal today. Consent reviewed and signed by patient. -Ingrown nail excised. See procedure note. -Educated on post-procedure care including soaking. Written instructions provided and reviewed. -Patient to follow up in 2 weeks for nail check.  Procedure: Avulsion of toenail Location: Bilateral 1st  Anesthesia: Lidocaine 1% plain; 1.5 mL and Marcaine 0.5% plain; 1.5 mL, digital block. Skin Prep: Betadine. Dressing: Silvadene; telfa; dry, sterile, compression dressing. Technique: Following skin prep, the toe was exsanguinated and a tourniquet was secured at the base of the toe. The nail border was freed split and avulsed with a hemostat. The area was cleansed. The tourniquet was then removed and sterile dressing applied. Disposition: Patient tolerated procedure well.    Nail dystrophy -Dispense tolcylen gel  Plantar Fasciitis, bilaterally; Achilles tendonitis - XR reviewed as above.  - Educated on icing and stretching. Instructions given.  - Night splint dispensed, plantar fascial brace dispensed.    Return in about 3 weeks (around 11/09/2018).

## 2018-10-23 ENCOUNTER — Other Ambulatory Visit: Payer: Self-pay | Admitting: Obstetrics

## 2018-10-23 DIAGNOSIS — R928 Other abnormal and inconclusive findings on diagnostic imaging of breast: Secondary | ICD-10-CM

## 2018-10-24 ENCOUNTER — Ambulatory Visit
Admission: RE | Admit: 2018-10-24 | Discharge: 2018-10-24 | Disposition: A | Discharge status: Home / Self Care | Payer: No Typology Code available for payment source | Source: Ambulatory Visit | Attending: Obstetrics | Admitting: Obstetrics

## 2018-10-24 ENCOUNTER — Ambulatory Visit
Admission: RE | Admit: 2018-10-24 | Discharge: 2018-10-24 | Disposition: A | Discharge status: Home / Self Care | Payer: 59 | Source: Ambulatory Visit | Attending: Obstetrics | Admitting: Obstetrics

## 2018-10-24 ENCOUNTER — Other Ambulatory Visit: Payer: Self-pay | Admitting: Podiatry

## 2018-10-24 ENCOUNTER — Other Ambulatory Visit: Payer: Self-pay | Admitting: Obstetrics

## 2018-10-24 ENCOUNTER — Other Ambulatory Visit: Payer: Self-pay

## 2018-10-24 DIAGNOSIS — N6320 Unspecified lump in the left breast, unspecified quadrant: Secondary | ICD-10-CM

## 2018-10-24 DIAGNOSIS — M7661 Achilles tendinitis, right leg: Secondary | ICD-10-CM

## 2018-10-24 DIAGNOSIS — R928 Other abnormal and inconclusive findings on diagnostic imaging of breast: Secondary | ICD-10-CM

## 2018-10-24 DIAGNOSIS — N6002 Solitary cyst of left breast: Secondary | ICD-10-CM | POA: Diagnosis not present

## 2018-10-24 DIAGNOSIS — R922 Inconclusive mammogram: Secondary | ICD-10-CM | POA: Diagnosis not present

## 2018-10-24 DIAGNOSIS — M7662 Achilles tendinitis, left leg: Secondary | ICD-10-CM

## 2018-10-31 MED FILL — DULoxetine HCL 60 MG CPEP: 60 | 90 days supply | Qty: 90 | Fill #0

## 2018-11-16 ENCOUNTER — Ambulatory Visit: Payer: 59 | Admitting: Podiatry

## 2018-11-30 ENCOUNTER — Other Ambulatory Visit: Payer: Self-pay

## 2018-11-30 ENCOUNTER — Ambulatory Visit: Payer: 59 | Admitting: Podiatry

## 2018-11-30 DIAGNOSIS — M722 Plantar fascial fibromatosis: Secondary | ICD-10-CM | POA: Diagnosis not present

## 2018-11-30 DIAGNOSIS — M7662 Achilles tendinitis, left leg: Secondary | ICD-10-CM

## 2018-11-30 DIAGNOSIS — M7661 Achilles tendinitis, right leg: Secondary | ICD-10-CM

## 2018-11-30 MED ORDER — METHYLPREDNISOLONE 4 MG PO TBPK: ORAL_TABLET | ORAL | 0 refills | Status: DC

## 2018-11-30 MED FILL — METHYLPREDNISOLONE 4 MG TAB: 4 | 6 days supply | Qty: 21 | Fill #0

## 2018-12-19 IMAGING — CT CT ABD-PELV W/ CM
2 of 5 series · 16 of 46 positions shown, 18 images · IV contrast (Isovue)
Comparison: 06/07/2016

CLINICAL DATA: Abdominal pain.

EXAM:
CT ABDOMEN AND PELVIS WITH CONTRAST
TECHNIQUE: Multidetector CT imaging of the abdomen and pelvis was performed
using the standard protocol following bolus administration of
intravenous contrast.
CONTRAST:  100mL JZ0ELA-BEE IOPAMIDOL (JZ0ELA-BEE) INJECTION 61%

[Series 2: axial st · axial · 0.76mm/px · z∈[-411,-31]mm · 13 of 88 slices shown, 15 images]
[im 6/88  soft-tissue]
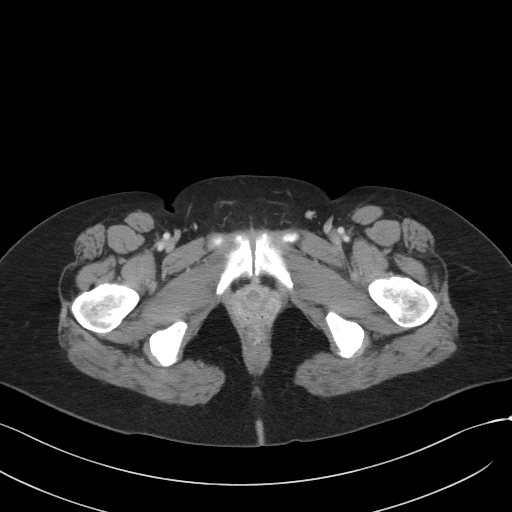
[im 6/88  bone]
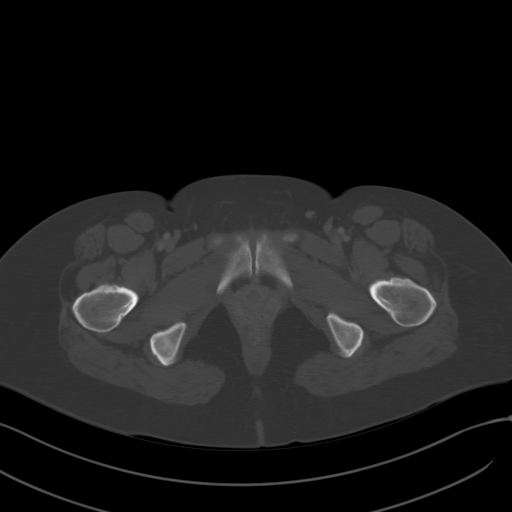
[im 11/88  soft-tissue]
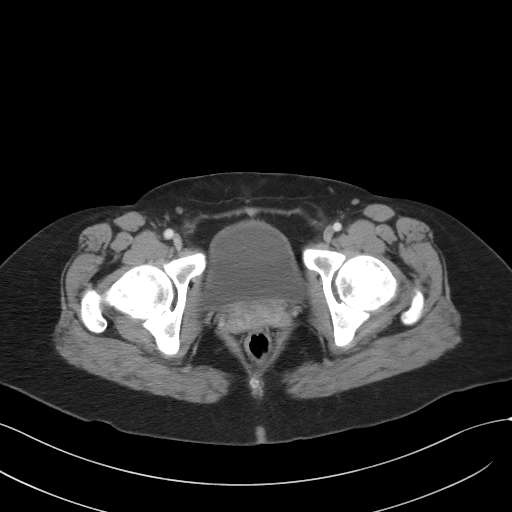
[im 21/88  soft-tissue]
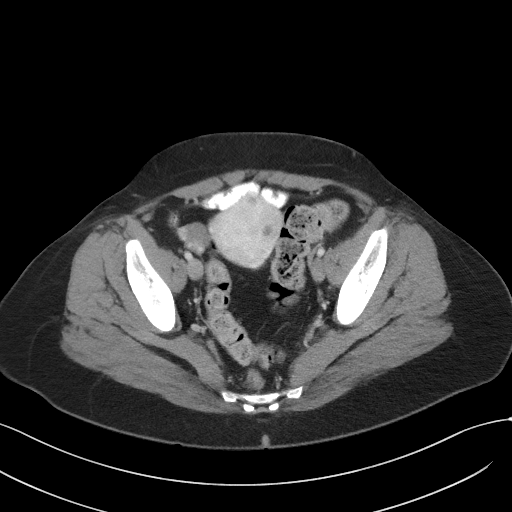
[im 26/88  soft-tissue]
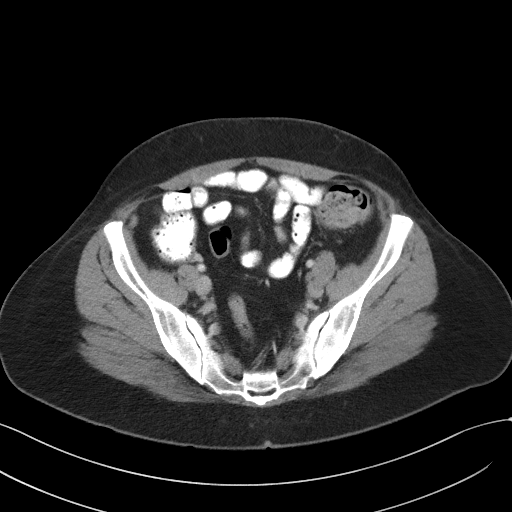
[im 31/88  soft-tissue]
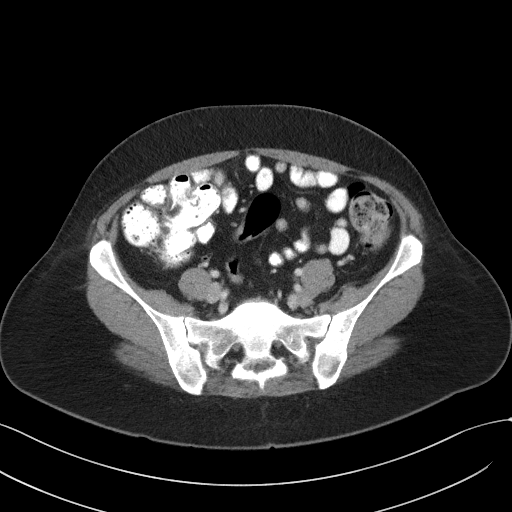
[im 36/88  soft-tissue]
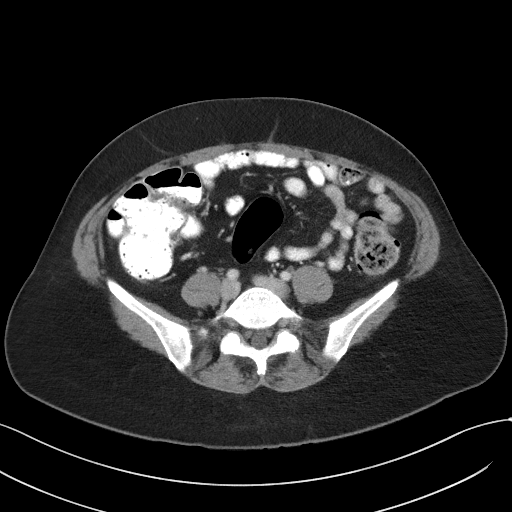
[im 47/88  soft-tissue]
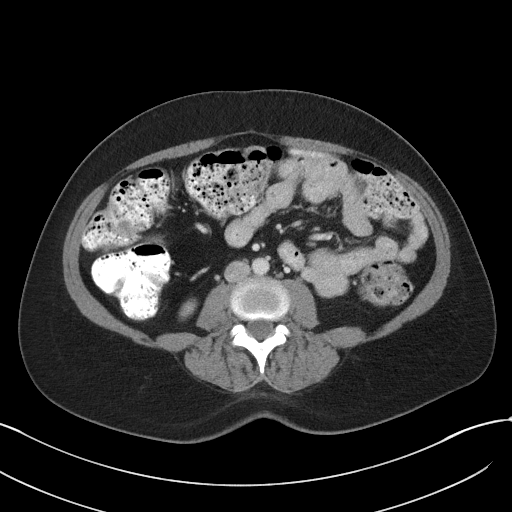
[im 52/88  soft-tissue]
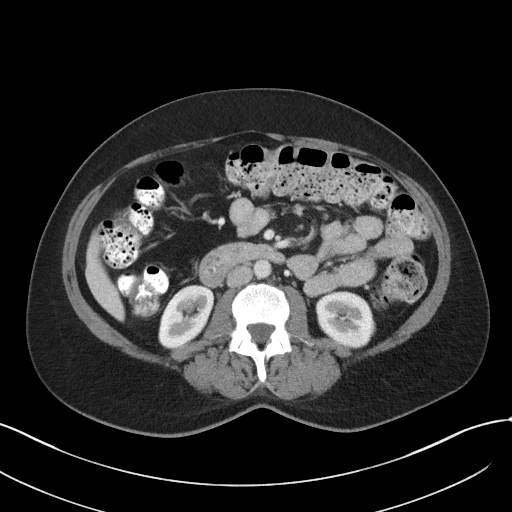
[im 57/88  soft-tissue]
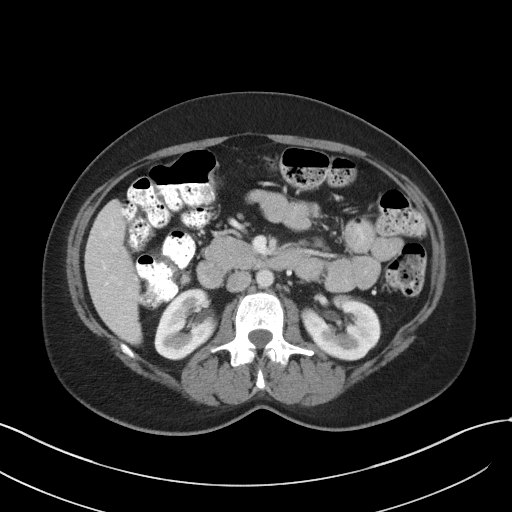
[im 57/88  bone]
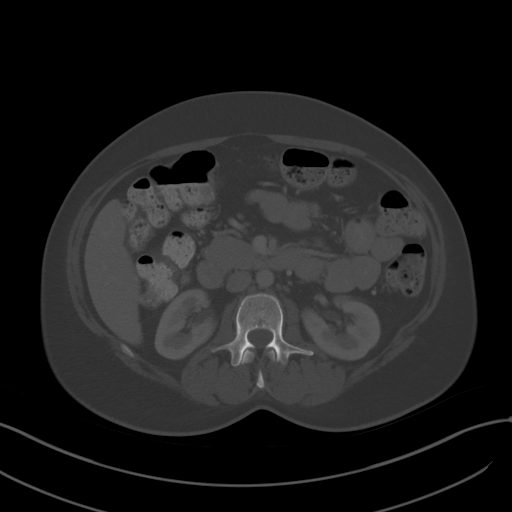
[im 62/88  soft-tissue]
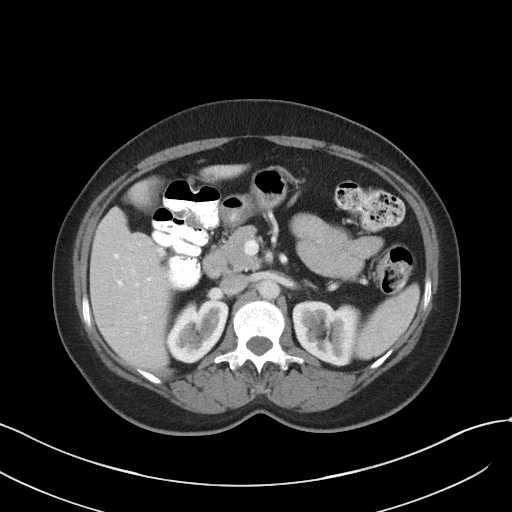
[im 67/88  soft-tissue]
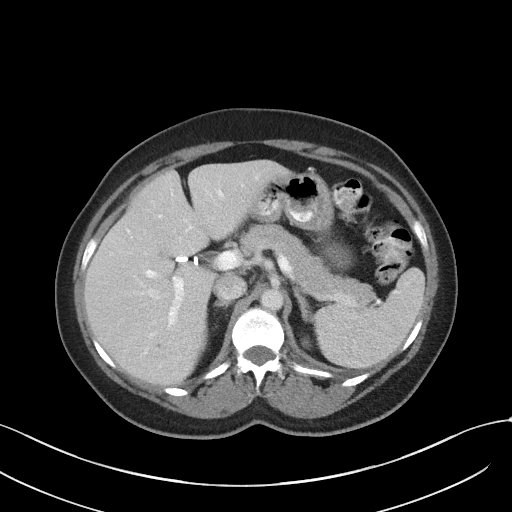
[im 77/88  soft-tissue]
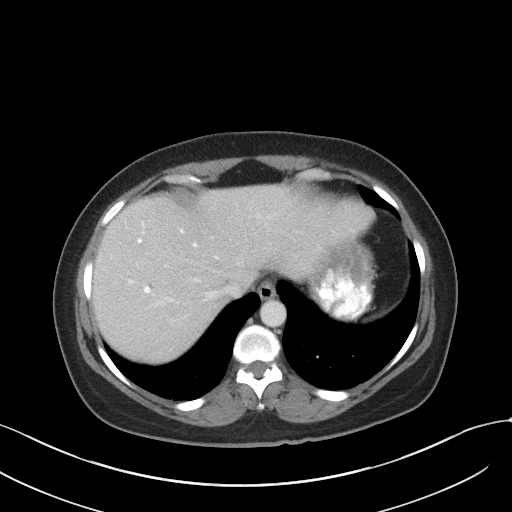
[im 82/88  soft-tissue]
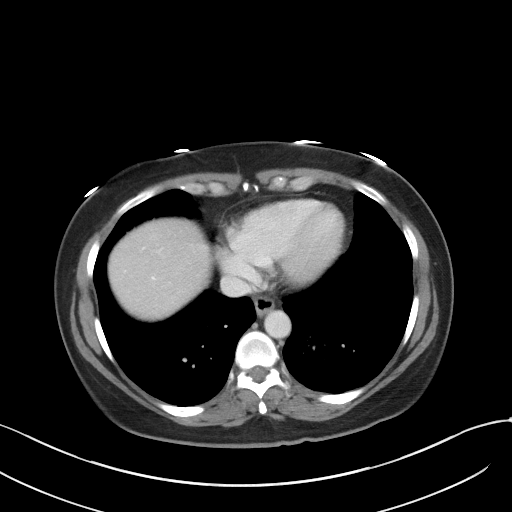

[Series 4: coronal st · coronal · 0.70mm/px · 3 of 90 slices shown]
[im 30/90  soft-tissue]
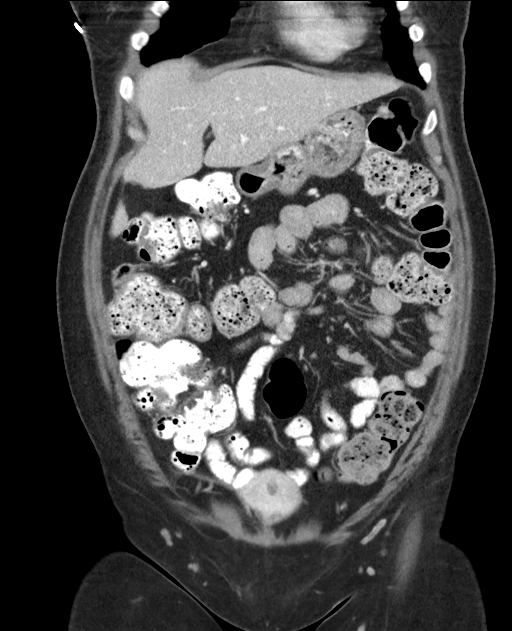
[im 40/90  soft-tissue]
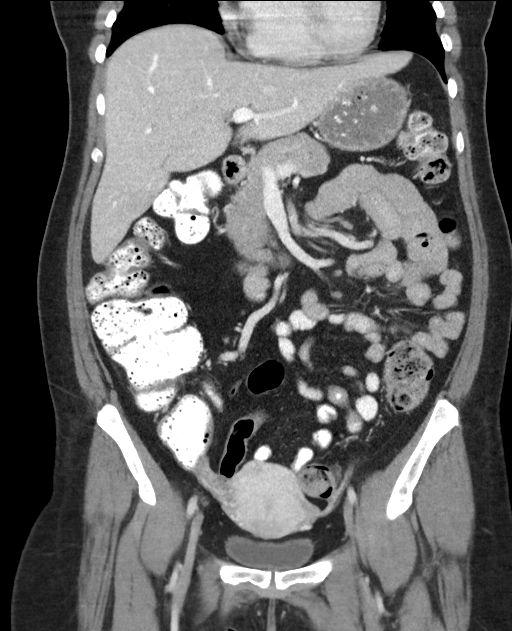
[im 50/90  soft-tissue]
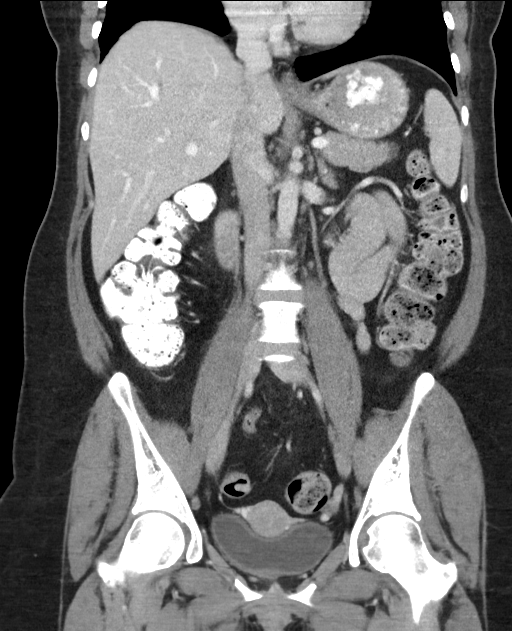

[16 of 46 positions shown; findings below may reference images not displayed]

FINDINGS: Lower chest: The lung bases are clear. No pleural or pericardial
effusion.

Hepatobiliary: No focal liver abnormality identified. Previous
cholecystectomy. No biliary dilatation.

Pancreas: Unremarkable. No pancreatic ductal dilatation or
surrounding inflammatory changes.

Spleen: Normal in size without focal abnormality.

Adrenals/Urinary Tract: The adrenal glands appear normal.
Unremarkable appearance of the kidneys. No mass or hydronephrosis.
The urinary bladder appears normal.

Stomach/Bowel: The stomach is normal. The small bowel loops have a
normal course and caliber. Interval resolution of previous small
bowel wall thickening and inflammation. The appendix is visualized
and appears within normal limits. No pathologic dilatation of the
colon.

Vascular/Lymphatic: No significant vascular findings are present. No
enlarged abdominal or pelvic lymph nodes.

Reproductive: Several small degenerating fibroids identified within
the uterus. The ovaries are unremarkable.

Other: There is no ascites or focal fluid collections within the
abdomen or pelvis.

Musculoskeletal: No acute or significant osseous findings.
IMPRESSION: 1. No acute findings identified within the abdomen or pelvis.
2. Interval resolution of previous small bowel wall inflammation. At
this time there is no abnormal small bowel wall thickening or
surrounding inflammation.

## 2018-12-22 ENCOUNTER — Other Ambulatory Visit: Payer: Self-pay

## 2018-12-22 DIAGNOSIS — Z20822 Contact with and (suspected) exposure to covid-19: Secondary | ICD-10-CM

## 2018-12-25 LAB — NOVEL CORONAVIRUS, NAA: SARS-CoV-2, NAA: NOT DETECTED

## 2018-12-29 ENCOUNTER — Other Ambulatory Visit: Payer: Self-pay

## 2018-12-29 ENCOUNTER — Ambulatory Visit: Payer: 59 | Admitting: Podiatry

## 2018-12-29 DIAGNOSIS — L603 Nail dystrophy: Secondary | ICD-10-CM

## 2018-12-29 DIAGNOSIS — M722 Plantar fascial fibromatosis: Secondary | ICD-10-CM

## 2018-12-29 DIAGNOSIS — L600 Ingrowing nail: Secondary | ICD-10-CM

## 2019-01-14 NOTE — Progress Notes (Signed)
Subjective:  Patient ID: Kathleen Price, female    DOB: 04/06/1970,  MRN: [id number]  Chief Complaint  Patient presents with  . Foot Pain    pt is here for a f/u on plantar fasciitis, pt states that her right foot is doing a little better and her left foot is still hurting as well, pt is also concerned about the right ingrown still hurting as well    48 y.o. female presents with the above complaint. Hx as above   Review of Systems: Negative except as noted in the HPI. Denies N/V/F/Ch.  Past Medical History:  Diagnosis Date  . Anxiety   . Headache(784.0)   . IBS (irritable bowel syndrome)     Current Outpatient Medications:  .  albuterol (PROVENTIL HFA;VENTOLIN HFA) 108 (90 Base) MCG/ACT inhaler, Inhale 1-2 puffs into the lungs every 6 (six) hours as needed for wheezing or shortness of breath., Disp: 1 Inhaler, Rfl: 0 .  atorvastatin (LIPITOR) 10 MG tablet, atorvastatin 10 mg tablet, Disp: , Rfl:  .  benzonatate (TESSALON) 100 MG capsule, Take 1 capsule (100 mg total) by mouth every 8 (eight) hours., Disp: 21 capsule, Rfl: 0 .  ciprofloxacin (CIPRO) 500 MG tablet, Take 1 tablet (500 mg total) by mouth 2 (two) times daily., Disp: 28 tablet, Rfl: 0 .  DULoxetine (CYMBALTA) 60 MG capsule, Take 60 mg by mouth daily., Disp: , Rfl:  .  ibuprofen (ADVIL,MOTRIN) 200 MG tablet, Take 800 mg by mouth every 6 (six) hours as needed for pain., Disp: , Rfl:  .  ipratropium (ATROVENT) 0.06 % nasal spray, Place 2 sprays into both nostrils 4 (four) times daily., Disp: 15 mL, Rfl: 0 .  metroNIDAZOLE (FLAGYL) 250 MG tablet, Take 1 tablet (250 mg total) by mouth 3 (three) times daily., Disp: 42 tablet, Rfl: 0 .  omeprazole (PRILOSEC OTC) 20 MG tablet, Take 20 mg by mouth daily as needed (acid reflux/upset stomach)., Disp: , Rfl:  .  ondansetron (ZOFRAN) 4 MG tablet, Take 1 tablet (4 mg total) by mouth every 6 (six) hours., Disp: 12 tablet, Rfl: 0 .  oxyCODONE-acetaminophen (PERCOCET/ROXICET) 5-325 MG  tablet, Take 1 tablet by mouth every 4 (four) hours as needed., Disp: 20 tablet, Rfl: 0 .  Pseudoeph-Doxylamine-DM-APAP (NYQUIL PO), Take by mouth., Disp: , Rfl:  .  Pseudoephedrine-APAP-DM (DAYQUIL PO), Take by mouth., Disp: , Rfl:  .  triamcinolone (NASACORT) 55 MCG/ACT AERO nasal inhaler, Place 2 sprays into the nose daily., Disp: 1 Inhaler, Rfl: 12 .  methylPREDNISolone (MEDROL DOSEPAK) 4 MG TBPK tablet, 6 Day Taper Pack. Take as Directed., Disp: 21 tablet, Rfl: 0  Social History   Tobacco Use  Smoking Status Current Every Day Smoker  . Packs/day: 0.50  . Years: 25.00  . Pack years: 12.50  . Types: Cigarettes  Smokeless Tobacco Never Used    Allergies  Allergen Reactions  . Morphine And Related   . Sulfa Antibiotics Nausea Only  . Tuberulin Old Human [Old Tuberculin]     Severe allergic skin reaction  . Wellbutrin [Bupropion Hcl] Hives   Objective:  There were no vitals filed for this visit. There is no height or weight on file to calculate BMI. Constitutional Well developed. Well nourished.  Vascular Dorsalis pedis pulses palpable bilaterally. Posterior tibial pulses palpable bilaterally. Capillary refill normal to all digits.  No cyanosis or clubbing noted. Pedal hair growth normal.  Neurologic Normal speech. Oriented to person, place, and time. Epicritic sensation to light touch grossly present bilaterally.  Dermatologic No skin lesions.  Orthopedic: Normal joint ROM without pain or crepitus bilaterally. No visible deformities. POP medial calc tuber bilat   Radiographs: None Assessment:   1. Plantar fasciitis   2. Achilles tendinitis of both lower extremities    Plan:  Patient was evaluated and treated and all questions answered.  Plantar Fasciitis, bilaterally; Achilles tendonitis -No injection today. Rx Medrol pack. -Continue stretching and icing.   Return in about 1 month (around 12/30/2018) for Tendonitis, Bilateral.

## 2019-01-21 NOTE — Progress Notes (Signed)
Subjective:  Patient ID: Kathleen Price, female    DOB: 1970-03-29,  MRN: [id number]  Chief Complaint  Patient presents with  . Nail Problem    Right 1st entire nail is painful to touch post ingrown removal  . Plantar Fasciitis    Pt states mostly resolved.    49 y.o. female presents with the above complaint. Hx as above   Review of Systems: Negative except as noted in the HPI. Denies N/V/F/Ch.  Past Medical History:  Diagnosis Date  . Anxiety   . Headache(784.0)   . IBS (irritable bowel syndrome)     Current Outpatient Medications:  .  albuterol (PROVENTIL HFA;VENTOLIN HFA) 108 (90 Base) MCG/ACT inhaler, Inhale 1-2 puffs into the lungs every 6 (six) hours as needed for wheezing or shortness of breath., Disp: 1 Inhaler, Rfl: 0 .  atorvastatin (LIPITOR) 10 MG tablet, atorvastatin 10 mg tablet, Disp: , Rfl:  .  benzonatate (TESSALON) 100 MG capsule, Take 1 capsule (100 mg total) by mouth every 8 (eight) hours., Disp: 21 capsule, Rfl: 0 .  ciprofloxacin (CIPRO) 500 MG tablet, Take 1 tablet (500 mg total) by mouth 2 (two) times daily., Disp: 28 tablet, Rfl: 0 .  DULoxetine (CYMBALTA) 60 MG capsule, Take 60 mg by mouth daily., Disp: , Rfl:  .  ibuprofen (ADVIL,MOTRIN) 200 MG tablet, Take 800 mg by mouth every 6 (six) hours as needed for pain., Disp: , Rfl:  .  ipratropium (ATROVENT) 0.06 % nasal spray, Place 2 sprays into both nostrils 4 (four) times daily., Disp: 15 mL, Rfl: 0 .  methylPREDNISolone (MEDROL DOSEPAK) 4 MG TBPK tablet, 6 Day Taper Pack. Take as Directed., Disp: 21 tablet, Rfl: 0 .  metroNIDAZOLE (FLAGYL) 250 MG tablet, Take 1 tablet (250 mg total) by mouth 3 (three) times daily., Disp: 42 tablet, Rfl: 0 .  omeprazole (PRILOSEC OTC) 20 MG tablet, Take 20 mg by mouth daily as needed (acid reflux/upset stomach)., Disp: , Rfl:  .  ondansetron (ZOFRAN) 4 MG tablet, Take 1 tablet (4 mg total) by mouth every 6 (six) hours., Disp: 12 tablet, Rfl: 0 .  oxyCODONE-acetaminophen  (PERCOCET/ROXICET) 5-325 MG tablet, Take 1 tablet by mouth every 4 (four) hours as needed., Disp: 20 tablet, Rfl: 0 .  Pseudoeph-Doxylamine-DM-APAP (NYQUIL PO), Take by mouth., Disp: , Rfl:  .  Pseudoephedrine-APAP-DM (DAYQUIL PO), Take by mouth., Disp: , Rfl:  .  triamcinolone (NASACORT) 55 MCG/ACT AERO nasal inhaler, Place 2 sprays into the nose daily., Disp: 1 Inhaler, Rfl: 12  Social History   Tobacco Use  Smoking Status Current Every Day Smoker  . Packs/day: 0.50  . Years: 25.00  . Pack years: 12.50  . Types: Cigarettes  Smokeless Tobacco Never Used    Allergies  Allergen Reactions  . Morphine And Related   . Sulfa Antibiotics Nausea Only  . Tuberulin Old Human [Old Tuberculin]     Severe allergic skin reaction  . Wellbutrin [Bupropion Hcl] Hives   Objective:  There were no vitals filed for this visit. There is no height or weight on file to calculate BMI. Constitutional Well developed. Well nourished.  Vascular Dorsalis pedis pulses palpable bilaterally. Posterior tibial pulses palpable bilaterally. Capillary refill normal to all digits.  No cyanosis or clubbing noted. Pedal hair growth normal.  Neurologic Normal speech. Oriented to person, place, and time. Epicritic sensation to light touch grossly present bilaterally.  Dermatologic No skin lesions.  Orthopedic: Normal joint ROM without pain or crepitus bilaterally. No visible deformities. Mild  POP medial calc tuber bilat   Radiographs: None Assessment:   1. Plantar fasciitis   2. Ingrown nail   3. Nail dystrophy    Plan:  Patient was evaluated and treated and all questions answered.  Plantar Fasciitis, bilaterally; Achilles tendonitis -No injection today  Ingrown nail -Nail gently debrided of ingrowing nail  No follow-ups on file.

## 2019-03-14 MED FILL — DULoxetine HCL 60 MG CPEP: 60 | 90 days supply | Qty: 90 | Fill #0

## 2019-04-25 ENCOUNTER — Ambulatory Visit
Admission: RE | Admit: 2019-04-25 | Discharge: 2019-04-25 | Disposition: A | Discharge status: Home / Self Care | Payer: 59 | Source: Ambulatory Visit | Attending: Obstetrics | Admitting: Obstetrics

## 2019-04-25 ENCOUNTER — Other Ambulatory Visit: Payer: Self-pay | Admitting: Obstetrics

## 2019-04-25 ENCOUNTER — Other Ambulatory Visit: Payer: Self-pay

## 2019-04-25 DIAGNOSIS — N6320 Unspecified lump in the left breast, unspecified quadrant: Secondary | ICD-10-CM

## 2019-04-25 DIAGNOSIS — N6314 Unspecified lump in the right breast, lower inner quadrant: Secondary | ICD-10-CM | POA: Diagnosis not present

## 2019-04-25 DIAGNOSIS — N6312 Unspecified lump in the right breast, upper inner quadrant: Secondary | ICD-10-CM | POA: Diagnosis not present

## 2019-06-14 MED FILL — DULoxetine HCL 60 MG CPEP: 60 | 90 days supply | Qty: 90 | Fill #0

## 2019-09-27 ENCOUNTER — Other Ambulatory Visit (HOSPITAL_COMMUNITY): Payer: Self-pay | Admitting: Family Medicine

## 2019-09-27 DIAGNOSIS — F330 Major depressive disorder, recurrent, mild: Secondary | ICD-10-CM | POA: Diagnosis not present

## 2019-09-27 DIAGNOSIS — E782 Mixed hyperlipidemia: Secondary | ICD-10-CM | POA: Diagnosis not present

## 2019-09-27 MED FILL — DULoxetine HCL 60 MG CPEP: 60 | 90 days supply | Qty: 90 | Fill #0

## 2019-09-27 MED FILL — ATORVASTATIN CALCIUM 10 MG: 10 | 90 days supply | Qty: 90 | Fill #0

## 2019-10-26 ENCOUNTER — Ambulatory Visit
Admission: RE | Admit: 2019-10-26 | Discharge: 2019-10-26 | Disposition: A | Discharge status: Home / Self Care | Payer: 59 | Source: Ambulatory Visit | Attending: Obstetrics | Admitting: Obstetrics

## 2019-10-26 ENCOUNTER — Other Ambulatory Visit: Payer: Self-pay

## 2019-10-26 DIAGNOSIS — N6320 Unspecified lump in the left breast, unspecified quadrant: Secondary | ICD-10-CM

## 2019-10-26 DIAGNOSIS — N6002 Solitary cyst of left breast: Secondary | ICD-10-CM | POA: Diagnosis not present

## 2019-10-26 DIAGNOSIS — R922 Inconclusive mammogram: Secondary | ICD-10-CM | POA: Diagnosis not present

## 2019-10-30 DIAGNOSIS — R7301 Impaired fasting glucose: Secondary | ICD-10-CM | POA: Diagnosis not present

## 2019-10-30 DIAGNOSIS — F330 Major depressive disorder, recurrent, mild: Secondary | ICD-10-CM | POA: Diagnosis not present

## 2019-10-30 DIAGNOSIS — M542 Cervicalgia: Secondary | ICD-10-CM | POA: Diagnosis not present

## 2019-10-30 DIAGNOSIS — M25531 Pain in right wrist: Secondary | ICD-10-CM | POA: Diagnosis not present

## 2019-10-30 DIAGNOSIS — M62838 Other muscle spasm: Secondary | ICD-10-CM | POA: Diagnosis not present

## 2019-10-30 DIAGNOSIS — Z712 Person consulting for explanation of examination or test findings: Secondary | ICD-10-CM | POA: Diagnosis not present

## 2019-10-30 DIAGNOSIS — E782 Mixed hyperlipidemia: Secondary | ICD-10-CM | POA: Diagnosis not present

## 2019-10-30 DIAGNOSIS — M7918 Myalgia, other site: Secondary | ICD-10-CM | POA: Diagnosis not present

## 2019-10-30 DIAGNOSIS — F331 Major depressive disorder, recurrent, moderate: Secondary | ICD-10-CM | POA: Diagnosis not present

## 2019-11-02 DIAGNOSIS — F17210 Nicotine dependence, cigarettes, uncomplicated: Secondary | ICD-10-CM | POA: Diagnosis not present

## 2019-11-02 DIAGNOSIS — E782 Mixed hyperlipidemia: Secondary | ICD-10-CM | POA: Diagnosis not present

## 2019-11-02 DIAGNOSIS — Z0001 Encounter for general adult medical examination with abnormal findings: Secondary | ICD-10-CM | POA: Diagnosis not present

## 2019-11-02 DIAGNOSIS — E663 Overweight: Secondary | ICD-10-CM | POA: Diagnosis not present

## 2019-11-02 DIAGNOSIS — R7301 Impaired fasting glucose: Secondary | ICD-10-CM | POA: Diagnosis not present

## 2019-11-02 DIAGNOSIS — F331 Major depressive disorder, recurrent, moderate: Secondary | ICD-10-CM | POA: Diagnosis not present

## 2019-11-02 DIAGNOSIS — Z6826 Body mass index (BMI) 26.0-26.9, adult: Secondary | ICD-10-CM | POA: Diagnosis not present

## 2019-11-02 DIAGNOSIS — R7303 Prediabetes: Secondary | ICD-10-CM | POA: Diagnosis not present

## 2019-12-19 DIAGNOSIS — H5213 Myopia, bilateral: Secondary | ICD-10-CM | POA: Diagnosis not present

## 2019-12-26 MED FILL — DULoxetine HCL 60 MG CPEP: 60 | 90 days supply | Qty: 90 | Fill #1

## 2020-02-27 ENCOUNTER — Encounter (INDEPENDENT_AMBULATORY_CARE_PROVIDER_SITE_OTHER): Payer: Self-pay | Admitting: Gastroenterology

## 2020-02-27 ENCOUNTER — Telehealth (INDEPENDENT_AMBULATORY_CARE_PROVIDER_SITE_OTHER): Payer: Self-pay

## 2020-02-27 ENCOUNTER — Encounter (INDEPENDENT_AMBULATORY_CARE_PROVIDER_SITE_OTHER): Payer: Self-pay

## 2020-02-27 ENCOUNTER — Other Ambulatory Visit (INDEPENDENT_AMBULATORY_CARE_PROVIDER_SITE_OTHER): Payer: Self-pay

## 2020-02-27 ENCOUNTER — Other Ambulatory Visit: Payer: Self-pay

## 2020-02-27 ENCOUNTER — Ambulatory Visit (INDEPENDENT_AMBULATORY_CARE_PROVIDER_SITE_OTHER): Payer: 59 | Admitting: Gastroenterology

## 2020-02-27 DIAGNOSIS — K581 Irritable bowel syndrome with constipation: Secondary | ICD-10-CM

## 2020-02-27 DIAGNOSIS — K644 Residual hemorrhoidal skin tags: Secondary | ICD-10-CM | POA: Insufficient documentation

## 2020-02-27 DIAGNOSIS — K589 Irritable bowel syndrome without diarrhea: Secondary | ICD-10-CM | POA: Insufficient documentation

## 2020-02-27 MED ORDER — NA SULFATE-K SULFATE-MG SULF 17.5-3.13-1.6 GM/177ML PO SOLN: 354.0000 mL | Freq: Once | ORAL | 0 refills | Status: DC

## 2020-02-27 MED ORDER — LINACLOTIDE 72 MCG PO CAPS: 72.0000 ug | ORAL_CAPSULE | Freq: Every day | ORAL | 1 refills | Status: DC

## 2020-02-27 MED FILL — LINZESS 72 MCG CAPSULE: 72 | 90 days supply | Qty: 90 | Fill #0

## 2020-02-27 MED FILL — SUPREP BOWEL PREP KIT: 17.5-3.13-1 | 1 days supply | Qty: 354 | Fill #0

## 2020-02-27 NOTE — Telephone Encounter (Signed)
Kathleen Price, CMA  

## 2020-02-27 NOTE — Progress Notes (Signed)
Maylon Peppers, M.D. Gastroenterology & Hepatology [facility] [address] [place], [place] [address]  Primary Care Physician: Celene Squibb, MD [place] [address]  I will communicate my assessment and recommendations to the referring MD via EMR.  Problems: 1. Nonthrombosed hemorrhoid 2. IBS-C  History of Present Illness: Kathleen Price is a 50 y.o. female with past medical history of anxiety, IBS-C, who presents for evaluation of dyschezia and constipation.  The patient was last seen on 07/06/2016. At that time, the patient was being evaluated for probable Crohn's disease given the presence of focal enteritis in CT scan and abdominal pain.  She was initially prescribed Humira but her insurance did not cover it and she never started taking the medication.  The patient underwent a capsule endoscopy on 07/28/2016 which showed normal small bowel lining.  The patient underwent a repeat CT of the abdomen with IV contrast On 08/17/2016 which showed resolution of the inflammation of the small bowel which rule out any questions about Crohn's disease.  The patient reports that past Sunday she presented new onset of abdominal cramping along with nausea and vomiting x1. She did not have any of these symptoms prior. After she initially presented these symptoms she had a bowel movement with normal consistency but endorsed having significant dyschezia. She reports that since then the perianal area has been painful and she is afraid to have a bowel movement. She also noticed having a sensation of a mass in the anal area since then. Denies any rectal bleeding or melena. She tried preparation H without any relief, so she tried [employer]	[place] with some improvement. She also tried using ice and warm sitz baths without improvement.  States the abdominal pain has resolved since her initial episode on Sunday as well as the nausea, but she has not  moved her bowels since then.  Patient reports that she has a bowel movement once a week on average for multiple years. She does not take anything for constipation. She tried multiple OTC in the past, even Miralax 1 cupful a day without improvement. She tried Amitiza 8 mcg BID in the past.  The patient denies having any nausea, vomiting, fever, chills, hematochezia, melena, hematemesis, abdominal distention, abdominal pain, diarrhea, jaundice, pruritus or weight loss.  Last Colonoscopy: 2011 - hemorrhoids  Past Medical History: Past Medical History:  Diagnosis Date  . Anxiety   . Headache(784.0)   . IBS (irritable bowel syndrome)     Past Surgical History: Past Surgical History:  Procedure Laterality Date  . ABLATION    . AGILE CAPSULE N/A 07/26/2016   Procedure: AGILE CAPSULE;  Surgeon: Rogene Houston, MD;  Location: [facility];  Service: Endoscopy;  Laterality: N/A;  . CESAREAN SECTION    . CHOLECYSTECTOMY    . GIVENS CAPSULE STUDY N/A 08/02/2016   Procedure: GIVENS CAPSULE STUDY;  Surgeon: Rogene Houston, MD;  Location: [facility];  Service: Endoscopy;  Laterality: N/A;  . TUBAL LIGATION      Family History: Family History  Problem Relation Age of Onset  . Ovarian cancer Mother   . Hypertension Father   . Dementia Father     Social History: Social History   Tobacco Use  Smoking Status Current Every Day Smoker  . Packs/day: 0.50  . Years: 25.00  . Pack years: 12.50  . Types: Cigarettes  Smokeless Tobacco Never Used   Social History   Substance and  Sexual Activity  Alcohol Use No   Social History   Substance and Sexual Activity  Drug Use No    Allergies: Allergies  Allergen Reactions  . Morphine And Related   . Sulfa Antibiotics Nausea Only  . Tuberulin Old Human [Old Tuberculin]     Severe allergic skin reaction  . Wellbutrin [Bupropion Hcl] Hives    Medications: Current Outpatient Medications  Medication Sig Dispense Refill  .  atorvastatin (LIPITOR) 10 MG tablet Take 10 mg by mouth daily.    . DULoxetine (CYMBALTA) 60 MG capsule Take 60 mg by mouth daily.     No current facility-administered medications for this visit.    Review of Systems: GENERAL: negative for malaise, night sweats HEENT: No changes in hearing or vision, no nose bleeds or other nasal problems. NECK: Negative for lumps, goiter, pain and significant neck swelling RESPIRATORY: Negative for cough, wheezing CARDIOVASCULAR: Negative for chest pain, leg swelling, palpitations, orthopnea GI: SEE HPI MUSCULOSKELETAL: Negative for joint pain or swelling, back pain, and muscle pain. SKIN: Negative for lesions, rash PSYCH: Negative for sleep disturbance, mood disorder and recent psychosocial stressors. HEMATOLOGY Negative for prolonged bleeding, bruising easily, and swollen nodes. ENDOCRINE: Negative for cold or heat intolerance, polyuria, polydipsia and goiter. NEURO: negative for tremor, gait imbalance, syncope and seizures. The remainder of the review of systems is noncontributory.   Physical Exam: BP 126/84 (BP Location: Left Arm, Patient Position: Sitting, Cuff Size: Large)   Pulse 90   Temp 97.8 F (36.6 C) (Oral)   Ht 5\' 4"  (1.626 m)   Wt 156 lb (70.8 kg)   BMI 26.78 kg/m  GENERAL: The patient is AO x3, in no acute distress. HEENT: Head is normocephalic and atraumatic. EOMI are intact. Mouth is well hydrated and without lesions. NECK: Supple. No masses LUNGS: Clear to auscultation. No presence of rhonchi/wheezing/rales. Adequate chest expansion HEART: RRR, normal s1 and s2. ABDOMEN: Soft, nontender, no guarding, no peritoneal signs, and nondistended. BS +. No masses. RECTAL: Presence of two external hemorrhoids, one located at 6 very tender to palpation but not indurated, a second hemorrhoid was located at 2 but it was not tender, no presence of fissures, no presence of masses, normal anal tone. Chaperon - [employer] EXTREMITIES:  Without any cyanosis, clubbing, rash, lesions or edema. NEUROLOGIC: AOx3, no focal motor deficit. SKIN: no jaundice, no rashes  Imaging/Labs: as above  I personally reviewed and interpreted the available labs, imaging and endoscopic files.  Impression and Plan: DORENA DORFMAN is a 50 y.o. female with past medical history of anxiety, IBS-C, who presents for evaluation of dyschezia and constipation.  The patient states that she presented new onset of severe dyschezia recently in the setting of longstanding constipation.  Physical exam she was found to have a tender external hemorrhoid but I do not believe it is thrombosed at this point.  I advised the patient to continue using topical therapy and doing warm sitz baths for now to improve her symptoms, but she will benefit from using laxatives to make her bowel movements softer to avoid friction of the hemorrhoids and straining.  Due to this I will start her on Linzess 72 mcg every day.  Due to the severity of her pain, the patient will be referred to general surgery for evaluation of her hemorrhoids to consider possible intervention if the symptoms do not improve with conservative measures.  The patient stated that she may cancel the appointment if her symptoms resolve.  The patient  is also due for colorectal cancer screening, for which I will schedule for a colonoscopy which will be scheduled in 6 weeks once her hemorrhoidal pain has resolved.  -Continue using Witch Hazel or preparation H to improve the rectal pain -Start Linzess 72 mcg qday -Continue with warm sitz baths -Referral to general surgery -Schedule colonoscopy in 6 weeks  All questions were answered.      Harvel Quale, MD Gastroenterology and Hepatology [facility]

## 2020-02-27 NOTE — Patient Instructions (Addendum)
Continue using witch hazel or preparation H to improve the rectal pain Start Linzess 72 mcg qday Continue with warm sitz baths Referral to general surgery Schedule colonoscopy in 6 weeks

## 2020-03-27 MED FILL — DULoxetine HCL 60 MG CPEP: 60 | 90 days supply | Qty: 90 | Fill #2

## 2020-03-31 ENCOUNTER — Encounter (HOSPITAL_COMMUNITY)
Admission: RE | Admit: 2020-03-31 | Discharge: 2020-03-31 | Disposition: A | Discharge status: Home / Self Care | Payer: 59 | Source: Ambulatory Visit | Attending: Gastroenterology | Admitting: Gastroenterology

## 2020-03-31 ENCOUNTER — Other Ambulatory Visit: Payer: Self-pay

## 2020-03-31 DIAGNOSIS — Z01812 Encounter for preprocedural laboratory examination: Secondary | ICD-10-CM | POA: Insufficient documentation

## 2020-03-31 DIAGNOSIS — Z20822 Contact with and (suspected) exposure to covid-19: Secondary | ICD-10-CM | POA: Diagnosis not present

## 2020-03-31 LAB — SARS CORONAVIRUS 2 (TAT 6-24 HRS): SARS Coronavirus 2: NEGATIVE

## 2020-04-01 ENCOUNTER — Encounter (HOSPITAL_COMMUNITY): Payer: Self-pay | Admitting: Gastroenterology

## 2020-04-01 ENCOUNTER — Ambulatory Visit (HOSPITAL_COMMUNITY)
Admission: RE | Admit: 2020-04-01 | Discharge: 2020-04-01 | Disposition: A | Discharge status: Home / Self Care | Payer: 59 | Attending: Gastroenterology | Admitting: Gastroenterology

## 2020-04-01 ENCOUNTER — Ambulatory Visit (HOSPITAL_COMMUNITY): Payer: 59 | Admitting: Anesthesiology

## 2020-04-01 ENCOUNTER — Other Ambulatory Visit: Payer: Self-pay

## 2020-04-01 ENCOUNTER — Encounter (HOSPITAL_COMMUNITY)
Admission: RE | Disposition: A | Discharge status: Home / Self Care | Payer: Self-pay | Source: Home / Self Care | Attending: Gastroenterology

## 2020-04-01 DIAGNOSIS — Z8041 Family history of malignant neoplasm of ovary: Secondary | ICD-10-CM | POA: Insufficient documentation

## 2020-04-01 DIAGNOSIS — D125 Benign neoplasm of sigmoid colon: Secondary | ICD-10-CM | POA: Insufficient documentation

## 2020-04-01 DIAGNOSIS — Z882 Allergy status to sulfonamides status: Secondary | ICD-10-CM | POA: Insufficient documentation

## 2020-04-01 DIAGNOSIS — Z885 Allergy status to narcotic agent status: Secondary | ICD-10-CM | POA: Insufficient documentation

## 2020-04-01 DIAGNOSIS — F17210 Nicotine dependence, cigarettes, uncomplicated: Secondary | ICD-10-CM | POA: Insufficient documentation

## 2020-04-01 DIAGNOSIS — K589 Irritable bowel syndrome without diarrhea: Secondary | ICD-10-CM | POA: Insufficient documentation

## 2020-04-01 DIAGNOSIS — Z888 Allergy status to other drugs, medicaments and biological substances status: Secondary | ICD-10-CM | POA: Diagnosis not present

## 2020-04-01 DIAGNOSIS — F419 Anxiety disorder, unspecified: Secondary | ICD-10-CM | POA: Insufficient documentation

## 2020-04-01 DIAGNOSIS — K648 Other hemorrhoids: Secondary | ICD-10-CM | POA: Insufficient documentation

## 2020-04-01 DIAGNOSIS — Z8249 Family history of ischemic heart disease and other diseases of the circulatory system: Secondary | ICD-10-CM | POA: Insufficient documentation

## 2020-04-01 DIAGNOSIS — Z1211 Encounter for screening for malignant neoplasm of colon: Secondary | ICD-10-CM | POA: Insufficient documentation

## 2020-04-01 DIAGNOSIS — Z79899 Other long term (current) drug therapy: Secondary | ICD-10-CM | POA: Diagnosis not present

## 2020-04-01 DIAGNOSIS — K635 Polyp of colon: Secondary | ICD-10-CM | POA: Diagnosis not present

## 2020-04-01 DIAGNOSIS — D123 Benign neoplasm of transverse colon: Secondary | ICD-10-CM | POA: Diagnosis not present

## 2020-04-01 HISTORY — PX: COLONOSCOPY WITH PROPOFOL: SHX5780

## 2020-04-01 HISTORY — PX: POLYPECTOMY: SHX5525

## 2020-04-01 LAB — PREGNANCY, URINE: Preg Test, Ur: NEGATIVE

## 2020-04-01 LAB — HM COLONOSCOPY

## 2020-04-01 SURGERY — COLONOSCOPY WITH PROPOFOL
Anesthesia: General

## 2020-04-01 MED ORDER — PROPOFOL 10 MG/ML IV BOLUS
INTRAVENOUS | Status: AC
  Filled 2020-04-01: qty 60

## 2020-04-01 MED ORDER — LIDOCAINE HCL (CARDIAC) PF 100 MG/5ML IV SOSY
PREFILLED_SYRINGE | INTRAVENOUS | Status: DC | PRN
  Administered 2020-04-01: 40 mg via INTRATRACHEAL

## 2020-04-01 MED ORDER — PROPOFOL 10 MG/ML IV BOLUS
INTRAVENOUS | Status: DC | PRN
  Administered 2020-04-01 (×5): 50 mg via INTRAVENOUS
  Administered 2020-04-01: 100 mg via INTRAVENOUS

## 2020-04-01 MED ORDER — LACTATED RINGERS IV SOLN: INTRAVENOUS | Status: DC | PRN

## 2020-04-01 MED ORDER — LACTATED RINGERS IV SOLN
INTRAVENOUS | Status: DC
  Administered 2020-04-01: 1000 mL via INTRAVENOUS

## 2020-04-01 NOTE — Transfer of Care (Signed)
Immediate Anesthesia Transfer of Care Note  Patient: Kathleen Price  Procedure(s) Performed: COLONOSCOPY WITH PROPOFOL (N/A ) POLYPECTOMY  Patient Location: PACU  Anesthesia Type:General  Level of Consciousness: awake, alert , oriented and patient cooperative  Airway & Oxygen Therapy: Patient Spontanous Breathing  Post-op Assessment: Report given to RN and Post -op Vital signs reviewed and stable  Post vital signs: Reviewed and stable  Last Vitals:  Vitals Value Taken Time  BP    Temp    Pulse    Resp    SpO2      Last Pain:  Vitals:   04/01/20 0727  TempSrc:   PainSc: 0-No pain      Patients Stated Pain Goal: 8 (01/27/01 4961)  Complications: No complications documented.

## 2020-04-01 NOTE — Anesthesia Preprocedure Evaluation (Addendum)
Anesthesia Evaluation  Patient identified by MRN, date of birth, ID band Patient awake    Reviewed: Allergy & Precautions, NPO status , Patient's Chart, lab work & pertinent test results  History of Anesthesia Complications Negative for: history of anesthetic complications  Airway Mallampati: I  TM Distance: >3 FB Neck ROM: Full    Dental  (+) Dental Advisory Given, Teeth Intact   Pulmonary Current Smoker,    Pulmonary exam normal breath sounds clear to auscultation       Cardiovascular negative cardio ROS Normal cardiovascular exam Rhythm:Regular Rate:Normal     Neuro/Psych  Headaches, Anxiety    GI/Hepatic negative GI ROS, Neg liver ROS,   Endo/Other  negative endocrine ROS  Renal/GU negative Renal ROS     Musculoskeletal negative musculoskeletal ROS (+)   Abdominal   Peds  Hematology negative hematology ROS (+)   Anesthesia Other Findings   Reproductive/Obstetrics negative OB ROS                            Anesthesia Physical Anesthesia Plan  ASA: II  Anesthesia Plan: General   Post-op Pain Management:    Induction: Intravenous  PONV Risk Score and Plan: Propofol infusion  Airway Management Planned: Nasal Cannula and Natural Airway  Additional Equipment:   Intra-op Plan:   Post-operative Plan:   Informed Consent: I have reviewed the patients History and Physical, chart, labs and discussed the procedure including the risks, benefits and alternatives for the proposed anesthesia with the patient or authorized representative who has indicated his/her understanding and acceptance.     Dental advisory given  Plan Discussed with: CRNA and Surgeon  Anesthesia Plan Comments:         Anesthesia Quick Evaluation

## 2020-04-01 NOTE — Anesthesia Postprocedure Evaluation (Signed)
Anesthesia Post Note  Patient: Arrabella A Ostermiller  Procedure(s) Performed: COLONOSCOPY WITH PROPOFOL (N/A ) POLYPECTOMY  Patient location during evaluation: Endoscopy Anesthesia Type: General Level of consciousness: awake, awake and alert, oriented and patient cooperative Pain management: pain level controlled Vital Signs Assessment: post-procedure vital signs reviewed and stable Respiratory status: spontaneous breathing and nonlabored ventilation Cardiovascular status: blood pressure returned to baseline and stable Postop Assessment: no apparent nausea or vomiting Anesthetic complications: no   No complications documented.   Last Vitals:  Vitals:   04/01/20 0657  BP: 111/82  Pulse: 75  Resp: 10  Temp: 36.7 C  SpO2: 96%    Last Pain:  Vitals:   04/01/20 0727  TempSrc:   PainSc: 0-No pain                 Mihail Prettyman

## 2020-04-01 NOTE — Discharge Instructions (Signed)
You are being discharged to home.  Resume your previous diet.  We are waiting for your pathology results.  Your physician has recommended a repeat colonoscopy for surveillance based on pathology results.    Colonoscopy, Adult, Care After This sheet gives you information about how to care for yourself after your procedure. Your doctor may also give you more specific instructions. If you have problems or questions, call your doctor. What can I expect after the procedure? After the procedure, it is common to have:  A small amount of blood in your poop (stool) for 24 hours.  Some gas.  Mild cramping or bloating in your belly (abdomen). Follow these instructions at home: Eating and drinking  Drink enough fluid to keep your pee (urine) pale yellow.  Follow instructions from your doctor about what you cannot eat or drink.  Return to your normal diet as told by your doctor. Avoid heavy or fried foods that are hard to digest.   Activity  Rest as told by your doctor.  Do not sit for a long time without moving. Get up to take short walks every 1-2 hours. This is important. Ask for help if you feel weak or unsteady.  Return to your normal activities as told by your doctor. Ask your doctor what activities are safe for you. To help cramping and bloating:  Try walking around.  Put heat on your belly as told by your doctor. Use the heat source that your doctor recommends, such as a moist heat pack or a heating pad. ? Put a towel between your skin and the heat source. ? Leave the heat on for 20-30 minutes. ? Remove the heat if your skin turns bright red. This is very important if you are unable to feel pain, heat, or cold. You may have a greater risk of getting burned.   General instructions  If you were given a medicine to help you relax (sedative) during your procedure, it can affect you for many hours. Do not drive or use machinery until your doctor says that it is safe.  For the first  24 hours after the procedure: ? Do not sign important documents. ? Do not drink alcohol. ? Do your daily activities more slowly than normal. ? Eat foods that are soft and easy to digest.  Take over-the-counter or prescription medicines only as told by your doctor.  Keep all follow-up visits as told by your doctor. This is important. Contact a doctor if:  You have blood in your poop 2-3 days after the procedure. Get help right away if:  You have more than a small amount of blood in your poop.  You see large clumps of tissue (blood clots) in your poop.  Your belly is swollen.  You feel like you may vomit (nauseous).  You vomit.  You have a fever.  You have belly pain that gets worse, and medicine does not help your pain. Summary  After the procedure, it is common to have a small amount of blood in your poop. You may also have mild cramping and bloating in your belly.  If you were given a medicine to help you relax (sedative) during your procedure, it can affect you for many hours. Do not drive or use machinery until your doctor says that it is safe.  Get help right away if you have a lot of blood in your poop, feel like you may vomit, have a fever, or have more belly pain. This information is not  intended to replace advice given to you by your health care provider. Make sure you discuss any questions you have with your health care provider. Document Revised: 11/10/2018 Document Reviewed: 07/31/2018 Elsevier Patient Education  2021 [employer].  Colonoscopy, Adult, Care After This sheet gives you information about how to care for yourself after your procedure. Your doctor may also give you more specific instructions. If you have problems or questions, call your doctor. What can I expect after the procedure? After the procedure, it is common to have:  A small amount of blood in your poop (stool) for 24 hours.  Some gas.  Mild cramping or bloating in your belly  (abdomen). Follow these instructions at home: Eating and drinking  Drink enough fluid to keep your pee (urine) pale yellow.  Follow instructions from your doctor about what you cannot eat or drink.  Return to your normal diet as told by your doctor. Avoid heavy or fried foods that are hard to digest.   Activity  Rest as told by your doctor.  Do not sit for a long time without moving. Get up to take short walks every 1-2 hours. This is important. Ask for help if you feel weak or unsteady.  Return to your normal activities as told by your doctor. Ask your doctor what activities are safe for you. To help cramping and bloating:  Try walking around.  Put heat on your belly as told by your doctor. Use the heat source that your doctor recommends, such as a moist heat pack or a heating pad. ? Put a towel between your skin and the heat source. ? Leave the heat on for 20-30 minutes. ? Remove the heat if your skin turns bright red. This is very important if you are unable to feel pain, heat, or cold. You may have a greater risk of getting burned.   General instructions  If you were given a medicine to help you relax (sedative) during your procedure, it can affect you for many hours. Do not drive or use machinery until your doctor says that it is safe.  For the first 24 hours after the procedure: ? Do not sign important documents. ? Do not drink alcohol. ? Do your daily activities more slowly than normal. ? Eat foods that are soft and easy to digest.  Take over-the-counter or prescription medicines only as told by your doctor.  Keep all follow-up visits as told by your doctor. This is important. Contact a doctor if:  You have blood in your poop 2-3 days after the procedure. Get help right away if:  You have more than a small amount of blood in your poop.  You see large clumps of tissue (blood clots) in your poop.  Your belly is swollen.  You feel like you may vomit  (nauseous).  You vomit.  You have a fever.  You have belly pain that gets worse, and medicine does not help your pain. Summary  After the procedure, it is common to have a small amount of blood in your poop. You may also have mild cramping and bloating in your belly.  If you were given a medicine to help you relax (sedative) during your procedure, it can affect you for many hours. Do not drive or use machinery until your doctor says that it is safe.  Get help right away if you have a lot of blood in your poop, feel like you may vomit, have a fever, or have more belly pain.  This information is not intended to replace advice given to you by your health care provider. Make sure you discuss any questions you have with your health care provider. Document Revised: 11/10/2018 Document Reviewed: 07/31/2018 Elsevier Patient Education  [place].

## 2020-04-01 NOTE — H&P (Signed)
Kathleen Price is an 50 y.o. female.   Chief Complaint: screening colonoscopy HPI: Kathleen Price is a 50 y.o. female with past medical history of anxiety, IBS-C, coming for screening colonoscopy. The patient had her last colonoscopy in 2011 which showed hemorrhoids.  The patient denies having any complaints such as melena, hematochezia, abdominal pain or distention, change in her bowel movement consistency or frequency, no changes in her weight recently.  No family history of colorectal cancer.  Patient previously presented some dyschezia and rectal bleeding which was considered to be secondary to hemorrhoids.  This has resolved of their the patient started using Linzess and using witch hazel, currently not present any other symptoms.   Past Medical History:  Diagnosis Date  . Anxiety   . Headache(784.0)   . IBS (irritable bowel syndrome)     Past Surgical History:  Procedure Laterality Date  . ABLATION    . AGILE CAPSULE N/A 07/26/2016   Procedure: AGILE CAPSULE;  Surgeon: Rogene Houston, MD;  Location: [facility];  Service: Endoscopy;  Laterality: N/A;  . CESAREAN SECTION    . CHOLECYSTECTOMY    . GIVENS CAPSULE STUDY N/A 08/02/2016   Procedure: GIVENS CAPSULE STUDY;  Surgeon: Rogene Houston, MD;  Location: [facility];  Service: Endoscopy;  Laterality: N/A;  . TUBAL LIGATION      Family History  Problem Relation Age of Onset  . Ovarian cancer Mother   . Hypertension Father   . Dementia Father    Social History:  reports that she has been smoking cigarettes. She has a 12.50 pack-year smoking history. She has never used smokeless tobacco. She reports that she does not drink alcohol and does not use drugs.  Allergies:  Allergies  Allergen Reactions  . Morphine And Related Other (See Comments)    "Impending doom feeling" "paranoia"  . Sulfa Antibiotics Nausea Only  . Tuberulin Old Human [Old Tuberculin]     Severe allergic skin reaction  . Wellbutrin [Bupropion  Hcl] Hives    Medications Prior to Admission  Medication Sig Dispense Refill  . atorvastatin (LIPITOR) 10 MG tablet Take 10 mg by mouth daily.    . Coenzyme Q10 (COQ10) 200 MG CAPS Take 200 mg by mouth daily.    . DULoxetine (CYMBALTA) 60 MG capsule Take 60 mg by mouth daily.    Marland Kitchen ibuprofen (ADVIL) 200 MG tablet Take 800 mg by mouth every 8 (eight) hours as needed (pain).    Marland Kitchen linaclotide (LINZESS) 72 MCG capsule Take 1 capsule (72 mcg total) by mouth daily before breakfast. 90 capsule 1  . Polyethyl Glycol-Propyl Glycol (SYSTANE) 0.4-0.3 % SOLN Place 1 drop into both eyes in the morning, at noon, and at bedtime.      Results for orders placed or performed during the hospital encounter of 04/01/20 (from the past 48 hour(s))  Pregnancy, urine     Status: None   Collection Time: 04/01/20  6:48 AM  Result Value Ref Range   Preg Test, Ur NEGATIVE NEGATIVE    Comment:        THE SENSITIVITY OF THIS METHODOLOGY IS >20 mIU/mL. Performed at [facility], [place], [address], [place], [place] [address]    No results found.  Review of Systems  Constitutional: Negative.   HENT: Negative.   Eyes: Negative.   Respiratory: Negative.   Cardiovascular: Negative.   Gastrointestinal: Negative.   Endocrine: Negative.   Genitourinary: Negative.   Musculoskeletal: Negative.   Skin: Negative.  Allergic/Immunologic: Negative.   Neurological: Negative.   Hematological: Negative.   Psychiatric/Behavioral: Negative.     Blood pressure 111/82, pulse 75, temperature 98.1 F (36.7 C), temperature source Oral, resp. rate 10, height 5\' 4"  (1.626 m), weight 70.3 kg, SpO2 96 %. Physical Exam  GENERAL: The patient is AO x3, in no acute distress. HEENT: Head is normocephalic and atraumatic. EOMI are intact. Mouth is well hydrated and without lesions. NECK: Supple. No masses LUNGS: Clear to auscultation. No presence of rhonchi/wheezing/rales. Adequate chest expansion HEART: RRR, normal s1 and s2. ABDOMEN:  Soft, nontender, no guarding, no peritoneal signs, and nondistended. BS +. No masses. EXTREMITIES: Without any cyanosis, clubbing, rash, lesions or edema. NEUROLOGIC: AOx3, no focal motor deficit. SKIN: no jaundice, no rashes  Assessment/Plan Kathleen Price is a 50 y.o. female with past medical history of anxiety, IBS-C, coming for screening colonoscopy. The patient is at average risk for colorectal cancer.  We will proceed with colonoscopy today.  Harvel Quale, MD 04/01/2020, 7:21 AM

## 2020-04-01 NOTE — Op Note (Signed)
[facility] Patient Name: Kathleen Price Procedure Date: 04/01/2020 7:08 AM MRN: [id number] Date of Birth: [birth year] Attending MD: Maylon Peppers ,  CSN: [id number] Age: 50 Admit Type: Outpatient Procedure:                Colonoscopy Indications:              Screening for colorectal malignant neoplasm Providers:                Maylon Peppers, Gwenlyn Fudge, RN, [place]                            Risa Grill, Technician Referring MD:              Medicines:                Monitored Anesthesia Care Complications:            No immediate complications. Estimated Blood Loss:     Estimated blood loss: none. Procedure:                Pre-Anesthesia Assessment:                           - Prior to the procedure, a History and Physical                            was performed, and patient medications, allergies                            and sensitivities were reviewed. The patient's                            tolerance of previous anesthesia was reviewed.                           - The risks and benefits of the procedure and the                            sedation options and risks were discussed with the                            patient. All questions were answered and informed                            consent was obtained.                           - ASA Grade Assessment: II - A patient with mild                            systemic disease.                           After obtaining informed consent, the colonoscope                            was passed under direct vision. Throughout the  procedure, the patient's blood pressure, pulse, and                            oxygen saturations were monitored continuously. The                            [id number]-[id number] ([id number]) scope was introduced through                            the anus and advanced to the the cecum, identified                            by appendiceal orifice and ileocecal valve. The                             colonoscopy was performed without difficulty. The                            patient tolerated the procedure well. The quality                            of the bowel preparation was good. Scope withdrawal                            time was 15 minutes. Scope In: 7:32:29 AM Scope Out: 8:10:52 AM Scope Withdrawal Time: 0 hours 28 minutes 39 seconds  Total Procedure Duration: 0 hours 38 minutes 23 seconds  Findings:      Hemorrhoids were found on perianal exam.      Three flat and sessile polyps were found in the transverse colon. The       polyps were 4 to 6 mm in size. These polyps were removed with a cold       snare. Resection and retrieval were complete.      A 2 mm polyp was found in the transverse colon. The polyp was sessile.       The polyp was removed with a cold biopsy forceps. Resection and       retrieval were complete.      A 2 mm polyp was found in the sigmoid colon. The polyp was sessile. The       polyp was removed with a cold snare. Resection and retrieval were       complete.      Non-bleeding external internal hemorrhoids were found during       retroflexion. The hemorrhoids were medium-sized. Impression:               - Hemorrhoids found on perianal exam.                           - Three 4 to 6 mm polyps in the transverse colon,                            removed with a cold snare. Resected and retrieved.                           - One  2 mm polyp in the transverse colon, removed                            with a cold biopsy forceps. Resected and retrieved.                           - One 2 mm polyp in the sigmoid colon, removed with                            a cold snare. Resected and retrieved.                           - Non-bleeding external internal hemorrhoids. Moderate Sedation:      Per Anesthesia Care Recommendation:           - Discharge patient to home (ambulatory).                           - Resume previous diet.                            - Await pathology results.                           - Repeat colonoscopy for surveillance based on                            pathology results. Procedure Code(s):        --- Professional ---                           [phone], Colonoscopy, flexible; with removal of                            tumor(s), polyp(s), or other lesion(s) by snare                            technique                           [id number], 55, Colonoscopy, flexible; with biopsy,                            single or multiple Diagnosis Code(s):        --- Professional ---                           K63.5, Polyp of colon                           Z12.11, Encounter for screening for malignant                            neoplasm of colon                           K64.4, Residual hemorrhoidal skin tags  K64.8, Other hemorrhoids CPT copyright 2019 American Medical Association. All rights reserved. The codes documented in this report are preliminary and upon coder review may  be revised to meet current compliance requirements. Maylon Peppers, MD Maylon Peppers,  04/01/2020 8:23:48 AM This report has been signed electronically. Number of Addenda: 0

## 2020-04-02 LAB — SURGICAL PATHOLOGY

## 2020-04-03 ENCOUNTER — Encounter (INDEPENDENT_AMBULATORY_CARE_PROVIDER_SITE_OTHER): Payer: Self-pay | Admitting: *Deleted

## 2020-04-03 ENCOUNTER — Encounter: Payer: Self-pay | Admitting: *Deleted

## 2020-04-08 ENCOUNTER — Encounter (HOSPITAL_COMMUNITY): Payer: Self-pay | Admitting: Gastroenterology

## 2020-04-17 ENCOUNTER — Telehealth: Payer: 59 | Admitting: Emergency Medicine

## 2020-04-17 DIAGNOSIS — H9209 Otalgia, unspecified ear: Secondary | ICD-10-CM

## 2020-04-17 DIAGNOSIS — J309 Allergic rhinitis, unspecified: Secondary | ICD-10-CM

## 2020-04-17 MED ORDER — AMOXICILLIN 500 MG PO CAPS: 500.0000 mg | ORAL_CAPSULE | Freq: Three times a day (TID) | ORAL | 0 refills | Status: DC

## 2020-04-17 NOTE — Progress Notes (Signed)
E visit for Allergic Rhinitis We are sorry that you are not feeling well.  Here is how we plan to help!  Based on what you have shared with me it looks like you have Allergic Rhinitis.  Rhinitis is when a reaction occurs that causes nasal congestion, runny nose, sneezing, and itching.  Most types of rhinitis are caused by an inflammation and are associated with symptoms in the eyes ears or throat. There are several types of rhinitis.  The most common are acute rhinitis, which is usually caused by a viral illness, allergic or seasonal rhinitis, and nonallergic or year-round rhinitis.  Nasal allergies occur certain times of the year.  Allergic rhinitis is caused when allergens in the air trigger the release of histamine in the body.  Histamine causes itching, swelling, and fluid to build up in the fragile linings of the nasal passages, sinuses and eyelids.  An itchy nose and clear discharge are common.  I think it's reasonable to trial an antibiotic given your low grade fever.  I've sent some Amoxicillin to your pharmacy.  I recommend the following over the counter treatments: Xyzal 5 mg take 1 tablet daily  I also would recommend a nasal spray: Flonase 2 sprays into each nostril once daily  You may also benefit from eye drops such as: Visine 1-2 drops each eye twice daily as needed  HOME CARE:   You can use an over-the-counter saline nasal spray as needed  Avoid areas where there is heavy dust, mites, or molds  Stay indoors on windy days during the pollen season  Keep windows closed in home, at least in bedroom; use air conditioner.  Use high-efficiency house air filter  Keep windows closed in car, turn AC on re-circulate  Avoid playing out with dog during pollen season  GET HELP RIGHT AWAY IF:   If your symptoms do not improve within 10 days  You become short of breath  You develop yellow or green discharge from your nose for over 3 days  You have coughing fits  MAKE SURE  YOU:   Understand these instructions  Will watch your condition  Will get help right away if you are not doing well or get worse  Thank you for choosing an e-visit. Your e-visit answers were reviewed by a board certified advanced clinical practitioner to complete your personal care plan. Depending upon the condition, your plan could have included both over the counter or prescription medications. Please review your pharmacy choice. Be sure that the pharmacy you have chosen is open so that you can pick up your prescription now.  If there is a problem you may message your provider in [place] to have the prescription routed to another pharmacy. Your safety is important to [place]. If you have drug allergies check your prescription carefully.  For the next 24 hours, you can use MyChart to ask questions about today's visit, request a non-urgent call back, or ask for a work or school excuse from your e-visit provider. You will get an email in the next two days asking about your experience. I hope that your e-visit has been valuable and will speed your recovery.        Approximately 5 minutes was used in reviewing the patient's chart, questionnaire, prescribing medications, and documentation.

## 2020-05-28 ENCOUNTER — Other Ambulatory Visit (HOSPITAL_COMMUNITY): Payer: Self-pay

## 2020-05-28 MED FILL — Linaclotide Cap 72 MCG: ORAL | 90 days supply | Qty: 90 | Fill #0 | Status: AC

## 2020-07-01 ENCOUNTER — Other Ambulatory Visit (HOSPITAL_COMMUNITY): Payer: Self-pay

## 2020-07-01 MED FILL — Duloxetine HCl Enteric Coated Pellets Cap 60 MG (Base Eq): ORAL | 90 days supply | Qty: 90 | Fill #0 | Status: AC

## 2020-08-27 ENCOUNTER — Other Ambulatory Visit (HOSPITAL_COMMUNITY): Payer: Self-pay

## 2020-08-27 ENCOUNTER — Other Ambulatory Visit (INDEPENDENT_AMBULATORY_CARE_PROVIDER_SITE_OTHER): Payer: Self-pay | Admitting: Gastroenterology

## 2020-08-27 DIAGNOSIS — K581 Irritable bowel syndrome with constipation: Secondary | ICD-10-CM

## 2020-08-27 MED ORDER — ATORVASTATIN CALCIUM 10 MG PO TABS
10.0000 mg | ORAL_TABLET | Freq: Every evening | ORAL | 2 refills | Status: DC
  Filled 2020-08-27: qty 90, 90d supply, fill #0

## 2020-08-27 MED ORDER — LINACLOTIDE 72 MCG PO CAPS
72.0000 ug | ORAL_CAPSULE | Freq: Every day | ORAL | 3 refills | Status: DC
  Filled 2020-08-27: qty 90, 90d supply, fill #0
  Filled 2020-12-08: qty 90, 90d supply, fill #1
  Filled 2021-03-17: qty 90, 90d supply, fill #2

## 2020-08-27 NOTE — Telephone Encounter (Signed)
Last seen 02/27/20 for IBS with constipation

## 2020-09-30 ENCOUNTER — Other Ambulatory Visit (HOSPITAL_COMMUNITY): Payer: Self-pay

## 2020-10-01 ENCOUNTER — Other Ambulatory Visit (HOSPITAL_COMMUNITY): Payer: Self-pay

## 2020-10-02 ENCOUNTER — Other Ambulatory Visit (HOSPITAL_COMMUNITY): Payer: Self-pay

## 2020-10-03 ENCOUNTER — Other Ambulatory Visit (HOSPITAL_COMMUNITY): Payer: Self-pay

## 2020-10-03 MED ORDER — DULOXETINE HCL 60 MG PO CPEP
60.0000 mg | ORAL_CAPSULE | Freq: Every day | ORAL | 3 refills | Status: DC
  Filled 2020-10-03: qty 90, 90d supply, fill #0
  Filled 2021-01-01: qty 90, 90d supply, fill #1
  Filled 2021-04-06: qty 90, 90d supply, fill #2
  Filled 2021-07-06: qty 90, 90d supply, fill #3

## 2020-11-04 DIAGNOSIS — R7301 Impaired fasting glucose: Secondary | ICD-10-CM | POA: Diagnosis not present

## 2020-11-04 DIAGNOSIS — E782 Mixed hyperlipidemia: Secondary | ICD-10-CM | POA: Diagnosis not present

## 2020-11-07 DIAGNOSIS — Z01419 Encounter for gynecological examination (general) (routine) without abnormal findings: Secondary | ICD-10-CM | POA: Diagnosis not present

## 2020-11-10 DIAGNOSIS — Z6825 Body mass index (BMI) 25.0-25.9, adult: Secondary | ICD-10-CM | POA: Diagnosis not present

## 2020-11-10 DIAGNOSIS — R7301 Impaired fasting glucose: Secondary | ICD-10-CM | POA: Diagnosis not present

## 2020-11-10 DIAGNOSIS — E663 Overweight: Secondary | ICD-10-CM | POA: Diagnosis not present

## 2020-11-10 DIAGNOSIS — F329 Major depressive disorder, single episode, unspecified: Secondary | ICD-10-CM | POA: Diagnosis not present

## 2020-11-10 DIAGNOSIS — Z049 Encounter for examination and observation for unspecified reason: Secondary | ICD-10-CM | POA: Diagnosis not present

## 2020-11-10 DIAGNOSIS — E782 Mixed hyperlipidemia: Secondary | ICD-10-CM | POA: Diagnosis not present

## 2020-11-10 DIAGNOSIS — Z0001 Encounter for general adult medical examination with abnormal findings: Secondary | ICD-10-CM | POA: Diagnosis not present

## 2020-12-08 ENCOUNTER — Other Ambulatory Visit (HOSPITAL_COMMUNITY): Payer: Self-pay

## 2020-12-08 DIAGNOSIS — R102 Pelvic and perineal pain: Secondary | ICD-10-CM | POA: Diagnosis not present

## 2020-12-31 DIAGNOSIS — Z1231 Encounter for screening mammogram for malignant neoplasm of breast: Secondary | ICD-10-CM | POA: Diagnosis not present

## 2021-01-01 ENCOUNTER — Other Ambulatory Visit (HOSPITAL_COMMUNITY): Payer: Self-pay

## 2021-01-08 ENCOUNTER — Telehealth: Payer: 59 | Admitting: Physician Assistant

## 2021-01-08 DIAGNOSIS — B9689 Other specified bacterial agents as the cause of diseases classified elsewhere: Secondary | ICD-10-CM

## 2021-01-08 DIAGNOSIS — J208 Acute bronchitis due to other specified organisms: Secondary | ICD-10-CM | POA: Diagnosis not present

## 2021-01-08 MED ORDER — AZITHROMYCIN 250 MG PO TABS: ORAL_TABLET | ORAL | 0 refills | Status: AC

## 2021-01-08 MED ORDER — BENZONATATE 100 MG PO CAPS: 100.0000 mg | ORAL_CAPSULE | Freq: Three times a day (TID) | ORAL | 0 refills | Status: DC | PRN

## 2021-01-08 MED ORDER — PREDNISONE 10 MG (21) PO TBPK: ORAL_TABLET | ORAL | 0 refills | Status: DC

## 2021-01-08 NOTE — Progress Notes (Signed)

## 2021-03-18 ENCOUNTER — Other Ambulatory Visit (HOSPITAL_COMMUNITY): Payer: Self-pay

## 2021-04-06 ENCOUNTER — Other Ambulatory Visit (HOSPITAL_COMMUNITY): Payer: Self-pay

## 2021-07-06 ENCOUNTER — Other Ambulatory Visit (HOSPITAL_COMMUNITY): Payer: Self-pay

## 2021-10-05 ENCOUNTER — Other Ambulatory Visit (HOSPITAL_COMMUNITY): Payer: Self-pay

## 2021-10-06 ENCOUNTER — Other Ambulatory Visit (HOSPITAL_COMMUNITY): Payer: Self-pay

## 2021-10-06 MED ORDER — DULOXETINE HCL 60 MG PO CPEP
60.0000 mg | ORAL_CAPSULE | Freq: Every day | ORAL | 1 refills | Status: DC
Start: 2021-10-06 — End: 2022-04-13
  Filled 2021-10-06: qty 90, 90d supply, fill #0
  Filled 2022-01-08: qty 90, 90d supply, fill #1

## 2022-01-12 DIAGNOSIS — Z043 Encounter for examination and observation following other accident: Secondary | ICD-10-CM | POA: Diagnosis not present

## 2022-01-12 DIAGNOSIS — Y999 Unspecified external cause status: Secondary | ICD-10-CM | POA: Diagnosis not present

## 2022-01-12 DIAGNOSIS — W010XXA Fall on same level from slipping, tripping and stumbling without subsequent striking against object, initial encounter: Secondary | ICD-10-CM | POA: Diagnosis not present

## 2022-01-12 DIAGNOSIS — M533 Sacrococcygeal disorders, not elsewhere classified: Secondary | ICD-10-CM | POA: Diagnosis not present

## 2022-04-11 ENCOUNTER — Other Ambulatory Visit (HOSPITAL_COMMUNITY): Payer: Self-pay

## 2022-04-13 ENCOUNTER — Other Ambulatory Visit (HOSPITAL_COMMUNITY): Payer: Self-pay

## 2022-04-13 MED ORDER — DULOXETINE HCL 60 MG PO CPEP
60.0000 mg | ORAL_CAPSULE | Freq: Every day | ORAL | 1 refills | Status: DC
  Filled 2022-04-13: qty 90, 90d supply, fill #0
  Filled 2022-07-26: qty 90, 90d supply, fill #1

## 2022-04-28 ENCOUNTER — Other Ambulatory Visit (HOSPITAL_COMMUNITY): Payer: Self-pay

## 2022-04-28 MED ORDER — THYROID 15 MG PO TABS
15.0000 mg | ORAL_TABLET | Freq: Every morning | ORAL | 3 refills | Status: DC
  Filled 2022-04-28 – 2022-05-21 (×2): qty 30, 30d supply, fill #0
  Filled 2022-10-18: qty 30, 30d supply, fill #1
  Filled 2022-12-08: qty 30, 30d supply, fill #2

## 2022-04-30 ENCOUNTER — Other Ambulatory Visit (HOSPITAL_COMMUNITY): Payer: Self-pay

## 2022-04-30 MED ORDER — PROGESTERONE 200 MG PO CAPS
200.0000 mg | ORAL_CAPSULE | Freq: Every day | ORAL | 3 refills | Status: DC
  Filled 2022-04-30 – 2022-05-21 (×2): qty 30, 30d supply, fill #0
  Filled 2022-06-23: qty 30, 30d supply, fill #1
  Filled 2022-08-24 – 2022-09-08 (×2): qty 30, 30d supply, fill #2
  Filled 2022-10-18: qty 30, 30d supply, fill #3

## 2022-05-13 ENCOUNTER — Other Ambulatory Visit (HOSPITAL_COMMUNITY): Payer: Self-pay

## 2022-05-21 ENCOUNTER — Other Ambulatory Visit (HOSPITAL_COMMUNITY): Payer: Self-pay

## 2022-06-08 ENCOUNTER — Ambulatory Visit: Payer: Commercial Managed Care - PPO

## 2022-09-06 ENCOUNTER — Other Ambulatory Visit (HOSPITAL_COMMUNITY): Payer: Self-pay

## 2022-09-08 ENCOUNTER — Other Ambulatory Visit (HOSPITAL_COMMUNITY): Payer: Self-pay

## 2022-10-07 ENCOUNTER — Other Ambulatory Visit (HOSPITAL_COMMUNITY): Payer: Self-pay

## 2022-10-07 DIAGNOSIS — M25571 Pain in right ankle and joints of right foot: Secondary | ICD-10-CM | POA: Diagnosis not present

## 2022-10-07 DIAGNOSIS — M25572 Pain in left ankle and joints of left foot: Secondary | ICD-10-CM | POA: Diagnosis not present

## 2022-10-07 MED ORDER — MELOXICAM 15 MG PO TABS
15.0000 mg | ORAL_TABLET | Freq: Every day | ORAL | 0 refills | Status: DC
  Filled 2022-10-07: qty 14, 14d supply, fill #0

## 2022-10-18 ENCOUNTER — Ambulatory Visit: Payer: Commercial Managed Care - PPO | Admitting: Podiatry

## 2022-10-18 ENCOUNTER — Other Ambulatory Visit (HOSPITAL_COMMUNITY): Payer: Self-pay

## 2022-10-18 ENCOUNTER — Ambulatory Visit (INDEPENDENT_AMBULATORY_CARE_PROVIDER_SITE_OTHER): Payer: Commercial Managed Care - PPO

## 2022-10-18 ENCOUNTER — Encounter: Payer: Self-pay | Admitting: Podiatry

## 2022-10-18 VITALS — BP 131/85 | HR 75

## 2022-10-18 DIAGNOSIS — M722 Plantar fascial fibromatosis: Secondary | ICD-10-CM | POA: Diagnosis not present

## 2022-10-18 DIAGNOSIS — M779 Enthesopathy, unspecified: Secondary | ICD-10-CM

## 2022-10-18 MED ORDER — TRIAMCINOLONE ACETONIDE 10 MG/ML IJ SUSP
10.0000 mg | Freq: Once | INTRAMUSCULAR | Status: AC
Start: 2022-10-18 — End: 2022-10-18
  Administered 2022-10-18: 10 mg via INTRA_ARTICULAR

## 2022-10-18 NOTE — Patient Instructions (Signed)

## 2022-10-18 NOTE — Progress Notes (Signed)
Subjective:   Patient ID: Kathleen Price, female   DOB: 52 y.o.   MRN: [id number]   HPI Patient presents stating she has pain in both of her ankles and she did fall in a hole about 4 months ago and the left gave her trouble and that started now it is bothering her in her right.  States they both get tender and worse when she gets up after periods of sitting with history of condition 4 years ago also complaining of thick toenails bilateral with the patient smoking half a pack of cigarettes per day and trying to stay active   Review of Systems  All other systems reviewed and are negative.       Objective:  Physical Exam Vitals and nursing note reviewed.  Constitutional:      Appearance: She is well-developed.  Pulmonary:     Effort: Pulmonary effort is normal.  Musculoskeletal:        General: Normal range of motion.  Skin:    General: Skin is warm.  Neurological:     Mental Status: She is alert.     Neurovascular status intact muscle strength was found to be adequate range of motion adequate with exquisite discomfort noted medial fascial band bilateral heels moderate discomfort also around the posterior tibial tendon left with thick nail disease hallux fifth bilateral several of the nails have discoloration     Assessment:  Probability for acute [place] fasciitis bilateral with compensatory gait pattern with mycotic nail infection     Plan:  H&P reviewed went ahead today having a focus on the heels I did sterile prep injected the plantar fascia bilateral 3 mg Kenalog 5 mg Xylocaine applied fascial brace left instructed on stretching discussed treatment for nails at next visit reappoint in 3 weeks  X-rays indicate spur no indication stress fracture continue Mobic treatment

## 2022-11-03 ENCOUNTER — Other Ambulatory Visit: Payer: Self-pay | Admitting: Internal Medicine

## 2022-11-03 DIAGNOSIS — Z1231 Encounter for screening mammogram for malignant neoplasm of breast: Secondary | ICD-10-CM

## 2022-11-08 ENCOUNTER — Ambulatory Visit: Payer: Commercial Managed Care - PPO | Admitting: Podiatry

## 2022-11-11 ENCOUNTER — Other Ambulatory Visit (HOSPITAL_COMMUNITY): Payer: Self-pay

## 2022-11-11 ENCOUNTER — Ambulatory Visit (INDEPENDENT_AMBULATORY_CARE_PROVIDER_SITE_OTHER): Payer: Commercial Managed Care - PPO | Admitting: Gastroenterology

## 2022-11-11 ENCOUNTER — Encounter (INDEPENDENT_AMBULATORY_CARE_PROVIDER_SITE_OTHER): Payer: Self-pay | Admitting: Gastroenterology

## 2022-11-11 VITALS — BP 129/80 | HR 73 | Temp 97.1°F | Ht 63.0 in | Wt 162.9 lb

## 2022-11-11 DIAGNOSIS — K644 Residual hemorrhoidal skin tags: Secondary | ICD-10-CM

## 2022-11-11 DIAGNOSIS — K625 Hemorrhage of anus and rectum: Secondary | ICD-10-CM | POA: Diagnosis not present

## 2022-11-11 DIAGNOSIS — K648 Other hemorrhoids: Secondary | ICD-10-CM | POA: Insufficient documentation

## 2022-11-11 DIAGNOSIS — K640 First degree hemorrhoids: Secondary | ICD-10-CM | POA: Insufficient documentation

## 2022-11-11 MED ORDER — HYDROCORTISONE ACETATE 25 MG RE SUPP
25.0000 mg | Freq: Two times a day (BID) | RECTAL | 0 refills | Status: AC
Start: 2022-11-11 — End: 2022-11-18
  Filled 2022-11-11: qty 14, 7d supply, fill #0

## 2022-11-11 NOTE — Progress Notes (Signed)
Katrinka Blazing, M.D. Gastroenterology & Hepatology [facility] Gastroenterology [address] [place], [place] [address]  Primary Care Physician: Benita Stabile, MD [address] [place] [address]  I will communicate my assessment and recommendations to the referring MD via EMR.  Problems: Nonthrombosed hemorrhoid IBS-C GERD   History of Present Illness: Kathleen Price is a  y.o. female with past medical history of anxiety, IBS-C, hemorrhoids, who presents for evaluation of rectal bleeding.  The patient was last seen on 02/27/2020. At that time, the patient was advised to start Linzess 72 mcg every day and to proceed with topical measures for management of hemorrhoids.  She was scheduled for colonoscopy as well.  Patient reports that she recently took meloxicam for some feet pain she had. After finishing a 2 week course, she has presented recurrent episodes of rectal bleeding since 10/26/2022. She states that every time she has a BM, she will see fresh blood in her toilet. No dyschezia. Has not tried any topical measures yet.  She is moving her bowels 1-2 times a day. She is not taking Linzess. Since she started hormone supplementation for menopause, her bowels have significantly improved.  She states that she has presented recurrent episodes of heartburn recently, for the last 2 weeks. She initially tried taking Tums, but now is taking OTC Prilosec 20 mg daily, which has controlled it. No dysphagia or odynophagia.  The patient denies having any nausea, vomiting, fever, chills, melena, hematemesis, abdominal distention, abdominal pain, diarrhea, jaundice, pruritus or weight loss.  Last Colonoscopy: 04/01/2020 - Hemorrhoids found on perianal exam. - Three 4 to 6 mm polyps in the transverse colon, removed with a cold snare. Resected and retrieved. - One 2 mm polyp in the transverse colon, removed with a cold biopsy forceps. Resected and retrieved. - One 2  mm polyp in the sigmoid colon, removed with a cold snare. Resected and retrieved. - Non- bleeding external internal hemorrhoids.  Pathology showed three tubular adenomas and two benign polypoid tissure, repeat colonoscopy in 5 years   Past Medical History: Past Medical History:  Diagnosis Date   Anxiety    Headache(784.0)    IBS (irritable bowel syndrome)     Past Surgical History: Past Surgical History:  Procedure Laterality Date   ABLATION     AGILE CAPSULE N/A 07/26/2016   Procedure: AGILE CAPSULE;  Surgeon: Malissa Hippo, MD;  [facility];  Service: Endoscopy;  Laterality: N/A;   CESAREAN SECTION     CHOLECYSTECTOMY     COLONOSCOPY WITH PROPOFOL N/A 04/01/2020   Procedure: COLONOSCOPY WITH PROPOFOL;  Surgeon: Dolores Frame, MD;  Location: [facility];  Service: Gastroenterology;  Laterality: N/A;  am   GIVENS CAPSULE STUDY N/A 08/02/2016   Procedure: GIVENS CAPSULE STUDY;  Surgeon: Malissa Hippo, MD;  Location: [facility];  Service: Endoscopy;  Laterality: N/A;   POLYPECTOMY  04/01/2020   Procedure: POLYPECTOMY;  Surgeon: Dolores Frame, MD;  Location: [facility];  Service: Gastroenterology;;   TUBAL LIGATION      Family History: Family History  Problem Relation Age of Onset   Ovarian cancer Mother    Hypertension Father    Dementia Father     Social History: Social History   Tobacco Use  Smoking Status Every Day   Current packs/day: 0.50   Average packs/day: 0.5 packs/day for 25.0 years (12.5 ttl pk-yrs)   Types: Cigarettes  Smokeless Tobacco Never  Social History   Substance and Sexual Activity  Alcohol Use No   Social History   Substance and Sexual Activity  Drug Use No    Allergies: Allergies  Allergen Reactions   Morphine And Codeine Other (See Comments)    "Impending doom feeling" "paranoia"   Sulfa Antibiotics Nausea Only   Tuberulin Old Human [Old Tuberculin]     Severe allergic skin reaction    Wellbutrin [Bupropion Hcl] Hives    Medications: Current Outpatient Medications  Medication Sig Dispense Refill   ibuprofen (ADVIL) 200 MG tablet Take 800 mg by mouth every 8 (eight) hours as needed (pain).     OVER THE COUNTER MEDICATION Vit D 3 with K2 once per day  Dim 300 mg daily  Citrus bergamot 1000 mg daily  Magnesium Qhs     Polyethyl Glycol-Propyl Glycol (SYSTANE) 0.4-0.3 % SOLN Place 1 drop into both eyes in the morning, at noon, and at bedtime.     progesterone (PROMETRIUM) 200 MG capsule Take 1 capsule (200 mg total) by mouth at bedtime. 30 capsule 3   thyroid (NP THYROID) 15 MG tablet Take 1 tablet (15 mg total) by mouth in the morning on an empty stomach. Wait 30 mins for any other food, drink, or meds. 30 tablet 3   No current facility-administered medications for this visit.    Review of Systems: GENERAL: negative for malaise, night sweats HEENT: No changes in hearing or vision, no nose bleeds or other nasal problems. NECK: Negative for lumps, goiter, pain and significant neck swelling RESPIRATORY: Negative for cough, wheezing CARDIOVASCULAR: Negative for chest pain, leg swelling, palpitations, orthopnea GI: SEE HPI MUSCULOSKELETAL: Negative for joint pain or swelling, back pain, and muscle pain. SKIN: Negative for lesions, rash PSYCH: Negative for sleep disturbance, mood disorder and recent psychosocial stressors. HEMATOLOGY Negative for prolonged bleeding, bruising easily, and swollen nodes. ENDOCRINE: Negative for cold or heat intolerance, polyuria, polydipsia and goiter. NEURO: negative for tremor, gait imbalance, syncope and seizures. The remainder of the review of systems is noncontributory.   Physical Exam: BP 129/80 (BP Location: Left Arm, Patient Position: Sitting, Cuff Size: Normal)   Pulse 73   Temp (!) 97.1 F (36.2 C) (Temporal)   Ht 5\' 3"  (1.6 m)   Wt 162 lb 14.4 oz (73.9 kg)   BMI 28.86 kg/m  GENERAL: The patient is AO x3, in no acute  distress. HEENT: Head is normocephalic and atraumatic. EOMI are intact. Mouth is well hydrated and without lesions. NECK: Supple. No masses LUNGS: Clear to auscultation. No presence of rhonchi/wheezing/rales. Adequate chest expansion HEART: RRR, normal s1 and s2. ABDOMEN: Soft, nontender, no guarding, no peritoneal signs, and nondistended. BS +. No masses. RECTAL EXAM: Small external hemorrhoids, normal tone, presence of boggy mucosa, brown stool without blood. Chaperone: Joanette Gula, CMA EXTREMITIES: Without any cyanosis, clubbing, rash, lesions or edema. NEUROLOGIC: AOx3, no focal motor deficit. SKIN: no jaundice, no rashes  Imaging/Labs: as above  I personally reviewed and interpreted the available labs, imaging and endoscopic files.  Impression and Plan: SHALUNDA TSE is a  y.o. female with past medical history of anxiety, IBS-C, hemorrhoids, who presents for evaluation of rectal bleeding.  The patient has presented recurrent episodes of rectal bleeding without any significant rectal pain or straining.  She denies any other associated symptoms.  Given her findings in physical exam today and previous endoscopic evaluation, it is very likely her symptoms are related to symptomatic internal hemorrhoids.  Due to this we will  attempt to treat this with with Anusol suppositories for a week.  However, explained that if she has persistent bleeding, we may need to consider performing hemorrhoidal banding.  Patient agreed and understood.  -Start hydrocortisone suppositories twice a day for next 7 days -If persistent rectal bleeding, please notify us to schedule you for hemorrhoidal banding  All questions were answered.      Katrinka Blazing, MD Gastroenterology and Hepatology [facility] Gastroenterology

## 2022-11-11 NOTE — Patient Instructions (Addendum)
Start hydrocortisone suppositories twice a day for next 7 days If persistent rectal bleeding, please notify us to schedule you for hemorrhoidal banding

## 2022-11-23 ENCOUNTER — Other Ambulatory Visit: Payer: Self-pay

## 2022-11-23 ENCOUNTER — Other Ambulatory Visit (HOSPITAL_COMMUNITY): Payer: Self-pay

## 2022-11-23 DIAGNOSIS — H5213 Myopia, bilateral: Secondary | ICD-10-CM | POA: Diagnosis not present

## 2022-11-23 MED ORDER — PROGESTERONE 200 MG PO CAPS
200.0000 mg | ORAL_CAPSULE | Freq: Every day | ORAL | 2 refills | Status: DC
  Filled 2022-11-23 – 2022-12-08 (×2): qty 30, 30d supply, fill #0
  Filled 2023-03-07: qty 30, 30d supply, fill #1
  Filled 2023-05-04: qty 30, 30d supply, fill #2

## 2022-11-24 ENCOUNTER — Ambulatory Visit
Admission: RE | Admit: 2022-11-24 | Discharge: 2022-11-24 | Disposition: A | Discharge status: Home / Self Care | Payer: Commercial Managed Care - PPO | Source: Ambulatory Visit

## 2022-11-24 ENCOUNTER — Other Ambulatory Visit (HOSPITAL_COMMUNITY): Payer: Self-pay

## 2022-11-24 DIAGNOSIS — Z1231 Encounter for screening mammogram for malignant neoplasm of breast: Secondary | ICD-10-CM | POA: Diagnosis not present

## 2022-12-08 ENCOUNTER — Other Ambulatory Visit: Payer: Self-pay

## 2022-12-08 ENCOUNTER — Other Ambulatory Visit (HOSPITAL_COMMUNITY): Payer: Self-pay

## 2022-12-09 ENCOUNTER — Other Ambulatory Visit (HOSPITAL_COMMUNITY): Payer: Self-pay

## 2022-12-21 ENCOUNTER — Other Ambulatory Visit (HOSPITAL_COMMUNITY): Payer: Self-pay

## 2023-03-08 ENCOUNTER — Other Ambulatory Visit (HOSPITAL_COMMUNITY): Payer: Self-pay

## 2023-04-04 DIAGNOSIS — I493 Ventricular premature depolarization: Secondary | ICD-10-CM | POA: Diagnosis not present

## 2023-04-04 DIAGNOSIS — R002 Palpitations: Secondary | ICD-10-CM | POA: Diagnosis not present

## 2023-06-21 ENCOUNTER — Other Ambulatory Visit (HOSPITAL_COMMUNITY): Payer: Self-pay

## 2023-06-21 MED ORDER — PROGESTERONE 200 MG PO CAPS
200.0000 mg | ORAL_CAPSULE | Freq: Every day | ORAL | 2 refills | Status: DC
  Filled 2023-06-21: qty 30, 30d supply, fill #0
  Filled 2023-08-04: qty 30, 30d supply, fill #1

## 2023-08-16 ENCOUNTER — Other Ambulatory Visit (HOSPITAL_COMMUNITY): Payer: Self-pay

## 2023-08-26 DIAGNOSIS — R7301 Impaired fasting glucose: Secondary | ICD-10-CM | POA: Diagnosis not present

## 2023-08-26 DIAGNOSIS — Z0000 Encounter for general adult medical examination without abnormal findings: Secondary | ICD-10-CM | POA: Diagnosis not present

## 2023-08-29 ENCOUNTER — Other Ambulatory Visit (HOSPITAL_COMMUNITY): Payer: Self-pay

## 2023-08-29 DIAGNOSIS — Z77120 Contact with and (suspected) exposure to mold (toxic): Secondary | ICD-10-CM | POA: Diagnosis not present

## 2023-08-29 MED ORDER — EZETIMIBE 10 MG PO TABS
10.0000 mg | ORAL_TABLET | Freq: Every day | ORAL | 2 refills | Status: DC
  Filled 2023-08-29: qty 30, 30d supply, fill #0

## 2023-09-01 ENCOUNTER — Other Ambulatory Visit (HOSPITAL_COMMUNITY): Payer: Self-pay

## 2023-09-01 DIAGNOSIS — R350 Frequency of micturition: Secondary | ICD-10-CM | POA: Diagnosis not present

## 2023-09-01 MED ORDER — NITROFURANTOIN MONOHYD MACRO 100 MG PO CAPS
100.0000 mg | ORAL_CAPSULE | Freq: Two times a day (BID) | ORAL | 0 refills | Status: AC
  Filled 2023-09-01: qty 10, 5d supply, fill #0

## 2023-09-12 ENCOUNTER — Other Ambulatory Visit (HOSPITAL_COMMUNITY): Payer: Self-pay

## 2023-09-16 ENCOUNTER — Encounter: Payer: Self-pay | Admitting: Podiatry

## 2023-09-16 ENCOUNTER — Other Ambulatory Visit (HOSPITAL_COMMUNITY): Payer: Self-pay

## 2023-09-16 ENCOUNTER — Ambulatory Visit (INDEPENDENT_AMBULATORY_CARE_PROVIDER_SITE_OTHER): Admitting: Podiatry

## 2023-09-16 VITALS — Ht 63.0 in | Wt 162.0 lb

## 2023-09-16 DIAGNOSIS — L600 Ingrowing nail: Secondary | ICD-10-CM

## 2023-09-16 DIAGNOSIS — M722 Plantar fascial fibromatosis: Secondary | ICD-10-CM

## 2023-09-16 MED ORDER — TERBINAFINE HCL 250 MG PO TABS
250.0000 mg | ORAL_TABLET | Freq: Every day | ORAL | 0 refills | Status: AC
  Filled 2023-09-16: qty 90, 90d supply, fill #0

## 2023-09-16 NOTE — Progress Notes (Signed)
 Subjective:   Patient ID: Kathleen Price, female   DOB: 54 y.o.   MRN: [id number]   HPI Patient presents with severe thickness of nail beds with a lot of pain in the hallux nail left over right that is been ongoing and she admits that she should have been seen for prior.  Also has chronic plantar fasciitis left over right that has been ongoing going on for years and she admits that when I saw her last year it did not respond to medication.  Patient has good digital perfusion well-oriented   ROS      Objective:  Physical Exam  Exquisite discomfort noted medial fascial band left over right heel with failure to respond to previous injection treatment with very thickened painful big toenails of both feet with pain when pressed from a dorsal direction and other nails that have thickness without the same degree of tenderness.  Patient has been dealing with this for extensive period of time     Assessment:  Chronic plantar fasciitis left over right that had minimal response to medication along with foot structural issues and severe nail disease chronic pain of the big toenails with other nails having thickness mild to moderate discomfort     Plan:  H&P all conditions reviewed.  I am frustrated by the lack of progress with the heels and I feel bad that she still has this degree of discomfort and I have recommended shockwave therapy at this time educating her on it.  I dispensed air fracture walker I want her to start with that now to offload weight off the heel and she will use it after procedures are done and I discussed big toenails and due to the thickness and the intensity of discomfort nail removals been recommended and this is what she wants and we will schedule for nail removal.  I went ahead today and I casted for functional orthotic devices to try to offload weight from her heels and again educated her on this ultimately may require surgery but we will get a try to hold off on that for now

## 2023-09-27 ENCOUNTER — Other Ambulatory Visit (HOSPITAL_COMMUNITY): Payer: Self-pay

## 2023-10-04 ENCOUNTER — Telehealth: Payer: Self-pay

## 2023-10-04 NOTE — Telephone Encounter (Signed)
 Called to schedule orthotic fitting/ pu VM box hasn't been set up unable to leave a message

## 2023-10-07 ENCOUNTER — Other Ambulatory Visit (HOSPITAL_COMMUNITY): Payer: Self-pay

## 2023-10-11 DIAGNOSIS — R1031 Right lower quadrant pain: Secondary | ICD-10-CM | POA: Diagnosis not present

## 2023-10-11 DIAGNOSIS — R102 Pelvic and perineal pain: Secondary | ICD-10-CM | POA: Diagnosis not present

## 2023-10-18 ENCOUNTER — Other Ambulatory Visit

## 2023-11-01 ENCOUNTER — Other Ambulatory Visit (HOSPITAL_COMMUNITY): Payer: Self-pay

## 2023-11-01 ENCOUNTER — Other Ambulatory Visit

## 2023-11-01 DIAGNOSIS — F329 Major depressive disorder, single episode, unspecified: Secondary | ICD-10-CM | POA: Diagnosis not present

## 2023-11-01 DIAGNOSIS — F331 Major depressive disorder, recurrent, moderate: Secondary | ICD-10-CM | POA: Diagnosis not present

## 2023-11-01 DIAGNOSIS — F411 Generalized anxiety disorder: Secondary | ICD-10-CM | POA: Diagnosis not present

## 2023-11-01 MED ORDER — DULOXETINE HCL 30 MG PO CPEP
30.0000 mg | ORAL_CAPSULE | Freq: Every day | ORAL | 1 refills | Status: AC
  Filled 2023-11-01: qty 30, 30d supply, fill #0

## 2023-11-01 NOTE — Patient Instructions (Incomplete)

## 2023-11-02 ENCOUNTER — Encounter (INDEPENDENT_AMBULATORY_CARE_PROVIDER_SITE_OTHER): Payer: Self-pay | Admitting: Gastroenterology

## 2023-11-02 ENCOUNTER — Other Ambulatory Visit (HOSPITAL_COMMUNITY): Payer: Self-pay

## 2023-11-08 ENCOUNTER — Other Ambulatory Visit

## 2023-11-12 DIAGNOSIS — F329 Major depressive disorder, single episode, unspecified: Secondary | ICD-10-CM | POA: Diagnosis not present

## 2023-11-12 DIAGNOSIS — F411 Generalized anxiety disorder: Secondary | ICD-10-CM | POA: Diagnosis not present

## 2023-11-17 DIAGNOSIS — F329 Major depressive disorder, single episode, unspecified: Secondary | ICD-10-CM | POA: Diagnosis not present

## 2023-11-17 DIAGNOSIS — F411 Generalized anxiety disorder: Secondary | ICD-10-CM | POA: Diagnosis not present

## 2023-11-23 DIAGNOSIS — F329 Major depressive disorder, single episode, unspecified: Secondary | ICD-10-CM | POA: Diagnosis not present

## 2023-11-23 DIAGNOSIS — F411 Generalized anxiety disorder: Secondary | ICD-10-CM | POA: Diagnosis not present

## 2023-11-24 ENCOUNTER — Other Ambulatory Visit (HOSPITAL_COMMUNITY): Payer: Self-pay

## 2023-11-24 DIAGNOSIS — F411 Generalized anxiety disorder: Secondary | ICD-10-CM | POA: Diagnosis not present

## 2023-11-24 DIAGNOSIS — F331 Major depressive disorder, recurrent, moderate: Secondary | ICD-10-CM | POA: Diagnosis not present

## 2023-11-24 MED ORDER — HYDROXYZINE PAMOATE 25 MG PO CAPS
25.0000 mg | ORAL_CAPSULE | Freq: Every day | ORAL | 1 refills | Status: AC
  Filled 2023-11-24 – 2024-01-10 (×2): qty 60, 30d supply, fill #0

## 2023-12-06 ENCOUNTER — Other Ambulatory Visit (HOSPITAL_COMMUNITY): Payer: Self-pay

## 2023-12-13 ENCOUNTER — Other Ambulatory Visit

## 2023-12-15 ENCOUNTER — Emergency Department (HOSPITAL_COMMUNITY)

## 2023-12-15 ENCOUNTER — Emergency Department (HOSPITAL_COMMUNITY)
Admission: EM | Admit: 2023-12-15 | Discharge: 2023-12-15 | Disposition: A | Discharge status: Home / Self Care | Attending: Emergency Medicine | Admitting: Emergency Medicine

## 2023-12-15 ENCOUNTER — Other Ambulatory Visit: Payer: Self-pay

## 2023-12-15 ENCOUNTER — Encounter (HOSPITAL_COMMUNITY): Payer: Self-pay

## 2023-12-15 DIAGNOSIS — D250 Submucous leiomyoma of uterus: Secondary | ICD-10-CM | POA: Insufficient documentation

## 2023-12-15 DIAGNOSIS — R110 Nausea: Secondary | ICD-10-CM | POA: Diagnosis not present

## 2023-12-15 DIAGNOSIS — R1031 Right lower quadrant pain: Secondary | ICD-10-CM | POA: Diagnosis not present

## 2023-12-15 DIAGNOSIS — R109 Unspecified abdominal pain: Secondary | ICD-10-CM

## 2023-12-15 DIAGNOSIS — N736 Female pelvic peritoneal adhesions (postinfective): Secondary | ICD-10-CM | POA: Diagnosis not present

## 2023-12-15 DIAGNOSIS — D259 Leiomyoma of uterus, unspecified: Secondary | ICD-10-CM | POA: Diagnosis not present

## 2023-12-15 DIAGNOSIS — R630 Anorexia: Secondary | ICD-10-CM | POA: Diagnosis not present

## 2023-12-15 LAB — URINALYSIS, ROUTINE W REFLEX MICROSCOPIC
Bilirubin Urine: NEGATIVE
Glucose, UA: NEGATIVE mg/dL
Hgb urine dipstick: NEGATIVE
Ketones, ur: NEGATIVE mg/dL
Leukocytes,Ua: NEGATIVE
Nitrite: NEGATIVE
Protein, ur: NEGATIVE mg/dL
Specific Gravity, Urine: 1.009 (ref 1.005–1.030)
pH: 6 (ref 5.0–8.0)

## 2023-12-15 LAB — COMPREHENSIVE METABOLIC PANEL WITH GFR
ALT: 18 U/L (ref 0–44)
AST: 21 U/L (ref 15–41)
Albumin: 3.8 g/dL (ref 3.5–5.0)
Alkaline Phosphatase: 47 U/L (ref 38–126)
Anion gap: 11 (ref 5–15)
BUN: 10 mg/dL (ref 6–20)
CO2: 24 mmol/L (ref 22–32)
Calcium: 9.3 mg/dL (ref 8.9–10.3)
Chloride: 101 mmol/L (ref 98–111)
Creatinine, Ser: 0.9 mg/dL (ref 0.44–1.00)
GFR, Estimated: >60 mL/min (ref >60)
Glucose, Bld: 111 mg/dL — ABNORMAL HIGH (ref 70–99)
Potassium: 3.8 mmol/L (ref 3.5–5.1)
Sodium: 136 mmol/L (ref 135–145)
Total Bilirubin: 0.5 mg/dL (ref 0.0–1.2)
Total Protein: 6.2 g/dL — ABNORMAL LOW (ref 6.5–8.1)

## 2023-12-15 LAB — CBC WITH DIFFERENTIAL/PLATELET
Abs Immature Granulocytes: 0.02 K/uL (ref 0.00–0.07)
Basophils Absolute: 0 K/uL (ref 0.0–0.1)
Basophils Relative: 0 %
Eosinophils Absolute: 0.4 K/uL (ref 0.0–0.5)
Eosinophils Relative: 4 %
HCT: 35.2 % — ABNORMAL LOW (ref 36.0–46.0)
Hemoglobin: 12.1 g/dL (ref 12.0–15.0)
Immature Granulocytes: 0 %
Lymphocytes Relative: 28 %
Lymphs Abs: 2.8 K/uL (ref 0.7–4.0)
MCH: 31.1 pg (ref 26.0–34.0)
MCHC: 34.4 g/dL (ref 30.0–36.0)
MCV: 90.5 fL (ref 80.0–100.0)
Monocytes Absolute: 0.6 K/uL (ref 0.1–1.0)
Monocytes Relative: 7 %
Neutro Abs: 6 K/uL (ref 1.7–7.7)
Neutrophils Relative %: 61 %
Platelets: 302 K/uL (ref 150–400)
RBC: 3.89 MIL/uL (ref 3.87–5.11)
RDW: 12.6 % (ref 11.5–15.5)
WBC: 9.9 K/uL (ref 4.0–10.5)
nRBC: 0 % (ref 0.0–0.2)

## 2023-12-15 LAB — LIPASE, BLOOD: Lipase: 48 U/L (ref 11–51)

## 2023-12-15 MED ORDER — IOHEXOL 350 MG/ML SOLN
75.0000 mL | Freq: Once | INTRAVENOUS | Status: AC | PRN
  Administered 2023-12-15: 75 mL via INTRAVENOUS

## 2023-12-15 NOTE — ED Triage Notes (Signed)
 C/O right sided abd pain and discomfort/discomfort/ nausea/ loss of appetite that started a couple months ago but worsening in last 2 weeks. Last BM was yesterday. C/O palpations and SHOB for 2 days. Axox4.

## 2023-12-15 NOTE — ED Provider Notes (Signed)
 [place] EMERGENCY DEPARTMENT AT [facility] Provider Note   CSN: [id number] Arrival date & time: 12/15/23  9255     Patient presents with: Abdominal Pain   Kathleen Price is a 53 y.o. female.   Pt complains right lower quadrant abdominal pain.  Pt has had discomfort in low back as well.  Pt has had some recent abdominal discomfort and saw gynecologist.  Pt reports normal evaluation.  Pt reports having loose stool.  Patient denies any fever or chills she has not had any nausea or vomiting.  Patient reports pain comes and goes.  Pain seems to become worse with eating.  Patient has tried Tums and over-the-counter acid relievers without relief.  Patient denies any black or tarry stools.  She has not seen any rectal bleeding no blood in urine.  Patient does report experiencing some low back pain.  Patient has a past medical history of 2 C-sections and a cholecystectomy.  The history is provided by the patient. No language interpreter was used.  Abdominal Pain Pain location:  RLQ Pain quality: aching   Pain radiates to:  Does not radiate Pain severity:  Moderate Onset quality:  Gradual Duration:  8 weeks Timing:  Constant Progression:  Worsening Chronicity:  New Relieved by:  Nothing Worsened by:  Eating Ineffective treatments:  Antacids Associated symptoms: no anorexia, no dysuria and no melena   Risk factors: not pregnant        Prior to Admission medications   Medication Sig Start Date End Date Taking? Authorizing Provider  DULoxetine  (CYMBALTA ) 30 MG capsule Take 1 capsule (30 mg total) by mouth at bedtime. 11/01/23     ezetimibe  (ZETIA ) 10 MG tablet Take 1 tablet (10 mg total) by mouth daily. 08/29/23     hydrOXYzine  (VISTARIL ) 25 MG capsule Take 1-2 capsules (25-50 mg total) by mouth at bedtime. 11/24/23     ibuprofen (ADVIL) 200 MG tablet Take 800 mg by mouth every 8 (eight) hours as needed (pain).    [provider]  OVER THE COUNTER MEDICATION Vit D  3 with K2 once per day  Dim 300 mg daily  Citrus bergamot 1000 mg daily  Magnesium Qhs    [provider]  Polyethyl Glycol-Propyl Glycol (SYSTANE) 0.4-0.3 % SOLN Place 1 drop into both eyes in the morning, at noon, and at bedtime.    [provider]  progesterone  (PROMETRIUM ) 200 MG capsule Take 1 capsule (200 mg total) by mouth daily. 06/21/23     terbinafine  (LAMISIL ) 250 MG tablet Take 1 tablet (250 mg total) by mouth daily. 09/16/23   Magdalen Pasco RAMAN, DPM  thyroid  (NP THYROID ) 15 MG tablet Take 1 tablet (15 mg total) by mouth in the morning on an empty stomach. Wait 30 mins for any other food, drink, or meds. 04/28/22       Allergies: Morphine  and codeine, Sulfa antibiotics, Tuberulin old human [old tuberculin], and Wellbutrin [bupropion hcl]    Review of Systems  Gastrointestinal:  Positive for abdominal pain. Negative for anorexia and melena.  Genitourinary:  Negative for dysuria.  All other systems reviewed and are negative.   Updated Vital Signs BP 120/82   Pulse 77   Temp 97.6 F (36.4 C)   Resp 17   Ht 5' 4 (1.626 m)   Wt 65.8 kg   SpO2 100%   BMI 24.89 kg/m   Physical Exam Vitals and nursing note reviewed.  Constitutional:      Appearance: She is  well-developed.  HENT:     Head: Normocephalic.  Eyes:     Extraocular Movements: Extraocular movements intact.  Cardiovascular:     Rate and Rhythm: Normal rate.  Pulmonary:     Effort: Pulmonary effort is normal.  Abdominal:     General: Abdomen is flat. Bowel sounds are normal. There is no distension.     Palpations: Abdomen is soft.     Tenderness: There is abdominal tenderness in the right lower quadrant.  Musculoskeletal:        General: Normal range of motion.     Cervical back: Normal range of motion.  Skin:    General: Skin is warm.  Neurological:     General: No focal deficit present.     Mental Status: She is alert and oriented to person, place, and time.     (all labs ordered  are listed, but only abnormal results are displayed) Labs Reviewed  CBC WITH DIFFERENTIAL/PLATELET  COMPREHENSIVE METABOLIC PANEL WITH GFR  URINALYSIS, ROUTINE W REFLEX MICROSCOPIC  LIPASE, BLOOD    EKG: None  Radiology: CT ABDOMEN PELVIS W CONTRAST Result Date: 12/15/2023 CLINICAL DATA:  Worsening right-sided abdominal pain for several weeks. Nausea. Anorexia. EXAM: CT ABDOMEN AND PELVIS WITH CONTRAST TECHNIQUE: Multidetector CT imaging of the abdomen and pelvis was performed using the standard protocol following bolus administration of intravenous contrast. RADIATION DOSE REDUCTION: This exam was performed according to the departmental dose-optimization program which includes automated exposure control, adjustment of the mA and/or kV according to patient size and/or use of iterative reconstruction technique. CONTRAST:  75mL OMNIPAQUE  IOHEXOL  350 MG/ML SOLN COMPARISON:  08/17/2016 FINDINGS: Lower Chest: No acute findings. Hepatobiliary: No suspicious hepatic masses identified. Prior cholecystectomy. No evidence of biliary obstruction. Pancreas:  No mass or inflammatory changes. Spleen: Within normal limits in size and appearance. Adrenals/Urinary Tract: No suspicious masses identified. No evidence of ureteral calculi or hydronephrosis. Unremarkable unopacified urinary bladder. Stomach/Bowel: No evidence of obstruction, inflammatory process or abnormal fluid collections. Normal appendix visualized. Vascular/Lymphatic: No pathologically enlarged lymph nodes. No acute vascular findings. Reproductive: Uterine adhesion to anterior abdominal wall, likely due to prior C-section. Focal myometrial soft tissue prominence measuring approximately 4 cm in the anterior corpus suspicious for fibroid. Adnexal regions are unremarkable. Other:  None. Musculoskeletal:  No suspicious bone lesions identified. IMPRESSION: No evidence of appendicitis or other acute findings. 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 Suspected small anterior uterine fibroid. Electronically Signed   By: Norleen DELENA Kil M.D.   On: 12/15/2023 09:53     Procedures   Medications Ordered in the ED - No data to display                                  Medical Decision Making Patient complains of right lower quadrant abdominal pain  Amount and/or Complexity of Data Reviewed Labs: ordered. Decision-making details documented in ED Course.    Details: Labs ordered reviewed and interpreted patient has a negative UA CBC is normal chemistries are normal Radiology: ordered and independent interpretation performed. Decision-making details documented in ED Course.    Details: CT abdomen and pelvis ordered reviewed and interpreted.  CT shows a 4 cm focal myometrial soft tissue prominence consistent with a fibroid.  CT also shows chronic uterine adhesions most likely due to cesarean section  Risk Prescription drug management. Risk Details: Patient is counseled on results of evaluation.  Patient has a gynecologist  and a gastroenterologist.  Patient is advised to schedule follow-up appointments for further evaluation.  Patient has been taking ibuprofen for pain relief.  She does not feel that she needs any prescription medication.  Patient is discharged in stable condition with follow-up instructions        Final diagnoses:  Abdominal pain, unspecified abdominal location  Uterine adhesions  Fibroids, submucosal    ED Discharge Orders     None      An After Visit Summary was printed and given to the patient.     Flint Sonny POUR, PA-C 12/15/23 1028    Elnor Jayson LABOR, DO 12/16/23 [phone]

## 2023-12-15 NOTE — Discharge Instructions (Addendum)
 Return if any problems.  Schedule to see your gynecologist for recheck.  Follow up with gastroenterology for recheck

## 2023-12-15 NOTE — ED Notes (Signed)
 CT called

## 2023-12-22 ENCOUNTER — Inpatient Hospital Stay (HOSPITAL_COMMUNITY)
Admission: EM | Admit: 2023-12-22 | Discharge: 2023-12-24 | DRG: 372 | Disposition: A | Attending: Family Medicine | Admitting: Family Medicine

## 2023-12-22 ENCOUNTER — Other Ambulatory Visit: Payer: Self-pay

## 2023-12-22 ENCOUNTER — Encounter (HOSPITAL_COMMUNITY): Payer: Self-pay

## 2023-12-22 ENCOUNTER — Emergency Department (HOSPITAL_COMMUNITY)

## 2023-12-22 DIAGNOSIS — K921 Melena: Secondary | ICD-10-CM | POA: Diagnosis not present

## 2023-12-22 DIAGNOSIS — R109 Unspecified abdominal pain: Secondary | ICD-10-CM | POA: Diagnosis not present

## 2023-12-22 DIAGNOSIS — N3289 Other specified disorders of bladder: Secondary | ICD-10-CM | POA: Diagnosis not present

## 2023-12-22 DIAGNOSIS — K581 Irritable bowel syndrome with constipation: Secondary | ICD-10-CM | POA: Diagnosis not present

## 2023-12-22 DIAGNOSIS — D72829 Elevated white blood cell count, unspecified: Secondary | ICD-10-CM | POA: Diagnosis not present

## 2023-12-22 DIAGNOSIS — K625 Hemorrhage of anus and rectum: Secondary | ICD-10-CM | POA: Diagnosis not present

## 2023-12-22 DIAGNOSIS — K649 Unspecified hemorrhoids: Secondary | ICD-10-CM | POA: Diagnosis not present

## 2023-12-22 DIAGNOSIS — R197 Diarrhea, unspecified: Secondary | ICD-10-CM | POA: Diagnosis not present

## 2023-12-22 DIAGNOSIS — K529 Noninfective gastroenteritis and colitis, unspecified: Principal | ICD-10-CM | POA: Diagnosis present

## 2023-12-22 LAB — URINALYSIS, ROUTINE W REFLEX MICROSCOPIC
Bilirubin Urine: NEGATIVE
Glucose, UA: NEGATIVE mg/dL
Hgb urine dipstick: NEGATIVE
Ketones, ur: NEGATIVE mg/dL
Leukocytes,Ua: NEGATIVE
Nitrite: NEGATIVE
Protein, ur: NEGATIVE mg/dL
Specific Gravity, Urine: 1.018 (ref 1.005–1.030)
pH: 5 (ref 5.0–8.0)

## 2023-12-22 LAB — COMPREHENSIVE METABOLIC PANEL WITH GFR
ALT: 20 U/L (ref 0–44)
AST: 24 U/L (ref 15–41)
Albumin: 4.4 g/dL (ref 3.5–5.0)
Alkaline Phosphatase: 64 U/L (ref 38–126)
Anion gap: 14 (ref 5–15)
BUN: 9 mg/dL (ref 6–20)
CO2: 20 mmol/L — ABNORMAL LOW (ref 22–32)
Calcium: 9 mg/dL (ref 8.9–10.3)
Chloride: 103 mmol/L (ref 98–111)
Creatinine, Ser: 0.73 mg/dL (ref 0.44–1.00)
GFR, Estimated: >60 mL/min (ref >60)
Glucose, Bld: 105 mg/dL — ABNORMAL HIGH (ref 70–99)
Potassium: 4 mmol/L (ref 3.5–5.1)
Sodium: 138 mmol/L (ref 135–145)
Total Bilirubin: 0.5 mg/dL (ref 0.0–1.2)
Total Protein: 7.1 g/dL (ref 6.5–8.1)

## 2023-12-22 LAB — CBC
HCT: 40.9 % (ref 36.0–46.0)
Hemoglobin: 14.2 g/dL (ref 12.0–15.0)
MCH: 31 pg (ref 26.0–34.0)
MCHC: 34.7 g/dL (ref 30.0–36.0)
MCV: 89.3 fL (ref 80.0–100.0)
Platelets: 409 K/uL — ABNORMAL HIGH (ref 150–400)
RBC: 4.58 MIL/uL (ref 3.87–5.11)
RDW: 13.1 % (ref 11.5–15.5)
WBC: 14.2 K/uL — ABNORMAL HIGH (ref 4.0–10.5)
nRBC: 0 % (ref 0.0–0.2)

## 2023-12-22 LAB — LIPASE, BLOOD: Lipase: 28 U/L (ref 11–51)

## 2023-12-22 MED ORDER — POLYETHYLENE GLYCOL 3350 17 G PO PACK: 17.0000 g | PACK | Freq: Every day | ORAL | Status: DC | PRN

## 2023-12-22 MED ORDER — ONDANSETRON HCL 4 MG/2ML IJ SOLN
4.0000 mg | Freq: Once | INTRAMUSCULAR | Status: AC
  Administered 2023-12-22: 4 mg via INTRAVENOUS
  Filled 2023-12-22: qty 2

## 2023-12-22 MED ORDER — HYDROMORPHONE HCL 1 MG/ML IJ SOLN
0.5000 mg | Freq: Once | INTRAMUSCULAR | Status: AC
  Administered 2023-12-22: 0.5 mg via INTRAVENOUS
  Filled 2023-12-22: qty 0.5

## 2023-12-22 MED ORDER — IOHEXOL 300 MG/ML  SOLN
100.0000 mL | Freq: Once | INTRAMUSCULAR | Status: AC | PRN
  Administered 2023-12-22: 100 mL via INTRAVENOUS

## 2023-12-22 MED ORDER — MELATONIN 5 MG PO TABS
6.0000 mg | ORAL_TABLET | Freq: Every evening | ORAL | Status: DC | PRN
  Administered 2023-12-23 (×2): 6 mg via ORAL
  Filled 2023-12-22 (×2): qty 2

## 2023-12-22 MED ORDER — ACETAMINOPHEN 500 MG PO TABS
500.0000 mg | ORAL_TABLET | Freq: Four times a day (QID) | ORAL | Status: DC | PRN
  Administered 2023-12-23 (×2): 500 mg via ORAL
  Filled 2023-12-22 (×2): qty 1

## 2023-12-22 MED ORDER — CIPROFLOXACIN IN D5W 400 MG/200ML IV SOLN
400.0000 mg | Freq: Once | INTRAVENOUS | Status: AC
  Administered 2023-12-22: 400 mg via INTRAVENOUS
  Filled 2023-12-22: qty 200

## 2023-12-22 MED ORDER — SODIUM CHLORIDE 0.9 % IV BOLUS
1000.0000 mL | Freq: Once | INTRAVENOUS | Status: AC
  Administered 2023-12-22: 1000 mL via INTRAVENOUS

## 2023-12-22 MED ORDER — PROCHLORPERAZINE EDISYLATE 10 MG/2ML IJ SOLN: 5.0000 mg | Freq: Four times a day (QID) | INTRAMUSCULAR | Status: DC | PRN

## 2023-12-22 MED ORDER — METRONIDAZOLE 500 MG/100ML IV SOLN
500.0000 mg | Freq: Once | INTRAVENOUS | Status: AC
  Administered 2023-12-22: 500 mg via INTRAVENOUS
  Filled 2023-12-22: qty 100

## 2023-12-22 NOTE — ED Provider Notes (Signed)
 [facility] Provider Note   CSN: [id number] Arrival date & time: 12/22/23  1706     Patient presents with: Hematochezia   Kathleen Price is a 53 y.o. female.   Patient complains of abdominal pain and bloody diarrhea.  No vomiting some nausea  The history is provided by the patient and medical records. No language interpreter was used.  Abdominal Pain Pain location:  Generalized Pain quality: aching   Pain radiates to:  Does not radiate Pain severity:  Moderate Onset quality:  Sudden Timing:  Constant Progression:  Waxing and waning Chronicity:  New Relieved by:  Nothing Worsened by:  Nothing Ineffective treatments:  None tried Associated symptoms: diarrhea   Associated symptoms: no chest pain, no cough, no fatigue and no hematuria        Prior to Admission medications   Medication Sig Start Date End Date Taking? Authorizing Provider  DULoxetine  (CYMBALTA ) 30 MG capsule Take 1 capsule (30 mg total) by mouth at bedtime. 11/01/23     ezetimibe  (ZETIA ) 10 MG tablet Take 1 tablet (10 mg total) by mouth daily. 08/29/23     hydrOXYzine  (VISTARIL ) 25 MG capsule Take 1-2 capsules (25-50 mg total) by mouth at bedtime. 11/24/23     ibuprofen (ADVIL) 200 MG tablet Take 800 mg by mouth every 8 (eight) hours as needed (pain).    [provider]  OVER THE COUNTER MEDICATION Vit D 3 with K2 once per day  Dim 300 mg daily  Citrus bergamot 1000 mg daily  Magnesium Qhs    [provider]  Polyethyl Glycol-Propyl Glycol (SYSTANE) 0.4-0.3 % SOLN Place 1 drop into both eyes in the morning, at noon, and at bedtime.    [provider]  progesterone  (PROMETRIUM ) 200 MG capsule Take 1 capsule (200 mg total) by mouth daily. 06/21/23     terbinafine  (LAMISIL ) 250 MG tablet Take 1 tablet (250 mg total) by mouth daily. 09/16/23   Magdalen Pasco RAMAN, DPM  thyroid  (NP THYROID ) 15 MG tablet Take 1 tablet (15 mg total) by mouth in the  morning on an empty stomach. Wait 30 mins for any other food, drink, or meds. 04/28/22       Allergies: Morphine  and codeine, Sulfa antibiotics, Tuberulin old human [old tuberculin], and Wellbutrin [bupropion hcl]    Review of Systems  Constitutional:  Negative for appetite change and fatigue.  HENT:  Negative for congestion, ear discharge and sinus pressure.   Eyes:  Negative for discharge.  Respiratory:  Negative for cough.   Cardiovascular:  Negative for chest pain.  Gastrointestinal:  Positive for abdominal pain, blood in stool and diarrhea.  Genitourinary:  Negative for frequency and hematuria.  Musculoskeletal:  Negative for back pain.  Skin:  Negative for rash.  Neurological:  Negative for seizures and headaches.  Psychiatric/Behavioral:  Negative for hallucinations.     Updated Vital Signs BP 135/85   Pulse 76   Temp 98.7 F (37.1 C) (Oral)   Resp 20   Ht 5' 4 (1.626 m)   Wt 65.8 kg   SpO2 97%   BMI 24.90 kg/m   Physical Exam Vitals and nursing note reviewed.  Constitutional:      Appearance: She is well-developed.  HENT:     Head: Normocephalic.     Nose: Nose normal.  Eyes:     General: No scleral icterus.    Conjunctiva/sclera: Conjunctivae normal.  Neck:     Thyroid : No thyromegaly.  Cardiovascular:     Rate and Rhythm: Normal rate and regular rhythm.     Heart sounds: No murmur heard.    No friction rub. No gallop.  Pulmonary:     Breath sounds: No stridor. No wheezing or rales.  Chest:     Chest wall: No tenderness.  Abdominal:     General: There is no distension.     Tenderness: There is abdominal tenderness. There is no rebound.  Musculoskeletal:        General: Normal range of motion.     Cervical back: Neck supple.  Lymphadenopathy:     Cervical: No cervical adenopathy.  Skin:    Findings: No erythema or rash.  Neurological:     Mental Status: She is alert and oriented to person, place, and time.     Motor: No abnormal muscle tone.      Coordination: Coordination normal.  Psychiatric:        Behavior: Behavior normal.     (all labs ordered are listed, but only abnormal results are displayed) Labs Reviewed  COMPREHENSIVE METABOLIC PANEL WITH GFR - Abnormal; Notable for the following components:      Result Value   CO2 20 (*)    Glucose, Bld 105 (*)    All other components within normal limits  CBC - Abnormal; Notable for the following components:   WBC 14.2 (*)    Platelets 409 (*)    All other components within normal limits  URINALYSIS, ROUTINE W REFLEX MICROSCOPIC - Abnormal; Notable for the following components:   APPearance CLOUDY (*)    All other components within normal limits  LIPASE, BLOOD    EKG: None  Radiology: CT ABDOMEN PELVIS W CONTRAST Result Date: 12/22/2023 CLINICAL DATA:  Abdominal pain, acute, nonlocalized EXAM: CT ABDOMEN AND PELVIS WITH CONTRAST TECHNIQUE: Multidetector CT imaging of the abdomen and pelvis was performed using the standard protocol following bolus administration of intravenous contrast. RADIATION DOSE REDUCTION: This exam was performed according to the departmental dose-optimization program which includes automated exposure control, adjustment of the mA and/or kV according to patient size and/or use of iterative reconstruction technique. CONTRAST:  OMNIPAQUE  IOHEXOL  300 MG/ML  SOLN COMPARISON:  December 15, 2023 FINDINGS: Lower chest: No focal airspace consolidation or pleural effusion. Hepatobiliary: No mass.Cholecystectomy. Mild dilation of the intrahepatic and extrahepatic bile ducts, likely related to the prior cholecystectomy. The portal veins are patent. Pancreas: No mass or main ductal dilation. No peripancreatic inflammation or fluid collection. Spleen: Normal size. No mass. Adrenals/Urinary Tract: No adrenal masses. No renal mass. No nephrolithiasis or hydronephrosis. Partially distended urinary bladder without visualized abnormality. Stomach/Bowel: The stomach is  decompressed without focal abnormality. No small bowel wall thickening or inflammation. No small bowel obstruction.Normal appendix. Moderate wall thickening of the descending colon with pericolonic inflammation. Vascular/Lymphatic: No aortic aneurysm. No intraabdominal or pelvic lymphadenopathy. Reproductive: Age-related atrophy of the uterus and ovaries. No concerning adnexal mass.No free pelvic fluid. Other: No pneumoperitoneum or ascites. Musculoskeletal: No acute fracture or destructive lesion. 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 IMPRESSION: Findings consistent with an infectious or inflammatory colitis of the descending colon. Electronically Signed   By: Rogelia Myers M.D.   On: 12/22/2023 21:40     Procedures   Medications Ordered in the ED  ciprofloxacin  (CIPRO ) IVPB 400 mg (400 mg Intravenous New Bag/Given 12/22/23 2241)  metroNIDAZOLE  (FLAGYL ) IVPB 500 mg (500 mg Intravenous New Bag/Given 12/22/23 2239)  sodium chloride  0.9 % bolus 1,000 mL (0 mLs Intravenous  Stopped 12/22/23 2135)  HYDROmorphone  (DILAUDID ) injection 0.5 mg (0.5 mg Intravenous Given 12/22/23 2020)  ondansetron  (ZOFRAN ) injection 4 mg (4 mg Intravenous Given 12/22/23 2020)  iohexol  (OMNIPAQUE ) 300 MG/ML solution 100 mL (100 mLs Intravenous Contrast Given 12/22/23 2031)  HYDROmorphone  (DILAUDID ) injection 0.5 mg (0.5 mg Intravenous Given 12/22/23 2221)                                    Medical Decision Making Amount and/or Complexity of Data Reviewed Labs: ordered. Radiology: ordered.  Risk Prescription drug management. Decision regarding hospitalization.   Patient has colitis.  She will be admitted for IV antibiotics     Final diagnoses:  Colitis    ED Discharge Orders     None          Suzette Pac, MD 12/23/23 1155

## 2023-12-22 NOTE — H&P (Incomplete)
 History and Physical  Kathleen Price FMW:[id number] DOB: January 07, 1971 DOA: 12/22/2023  Referring physician: Dr. Suzette, EDP  PCP: Shona Norleen PEDLAR, MD  Outpatient Specialists: GI. Patient coming from: Home.  Chief Complaint: Abdominal pain  HPI: Kathleen Price is a 53 y.o. female with medical history significant for internal hemorrhoids, IBS-constipation predominant, hyperlipidemia, chronic anxiety/depression, internal and external hemorrhoids, prior history of GI bleed after prolonged exposure to NSAIDs, who presents to the ER with complaints of abdominal pain associated with bloody stools x 6 last night.  Denies use of blood thinners.  Admits to occasional use of NSAIDs, ibuprofen for headaches.  No reported subjective fevers.  Has had chills.  In the ER, vital signs are stable.  Lab studies notable for leukocytosis WBC 14.2 K, serum bicarb 20.  CT abdomen and pelvis with contrast revealed findings consistent with an infectious or inflammatory colitis of the descending colon.  Had another episode of bloody stool in the ER.  The patient was started on IV ciprofloxacin  and IV Flagyl .  Additionally she received IV opiate based analgesics.  TRH, hospitalist service, was asked to admit.  ED Course: Temperature 97.7.  BP 111/80, pulse 76, respiration rate 18, O2 saturation 97% on room air.  Review of Systems: Review of systems as noted in the HPI. All other systems reviewed and are negative.   Past Medical History:  Diagnosis Date   Anxiety    Headache(784.0)    IBS (irritable bowel syndrome)    Past Surgical History:  Procedure Laterality Date   ABLATION     AGILE CAPSULE N/A 07/26/2016   Procedure: AGILE CAPSULE;  Surgeon: Golda Claudis PENNER, MD;  Location: [facility];  Service: Endoscopy;  Laterality: N/A;   CESAREAN SECTION     CHOLECYSTECTOMY     COLONOSCOPY WITH PROPOFOL  N/A 04/01/2020   Procedure: COLONOSCOPY WITH PROPOFOL ;  Surgeon: Eartha Angelia Sieving, MD;  Location: [facility]  END[facility];  Service: Gastroenterology;  Laterality: N/A;  am   GIVENS CAPSULE STUDY N/A 08/02/2016   Procedure: GIVENS CAPSULE STUDY;  Surgeon: Golda Claudis PENNER, MD;  Location: [facility];  Service: Endoscopy;  Laterality: N/A;   POLYPECTOMY  04/01/2020   Procedure: POLYPECTOMY;  Surgeon: Eartha Angelia Sieving, MD;  Location: [facility];  Service: Gastroenterology;;   TUBAL LIGATION      Social History:  reports that she has been smoking cigarettes. She has a 12.5 pack-year smoking history. She has been exposed to tobacco smoke. She has never used smokeless tobacco. She reports that she does not drink alcohol  and does not use drugs.   Allergies  Allergen Reactions   Morphine  And Codeine Other (See Comments)    Impending doom feeling paranoia   Sulfa Antibiotics Nausea Only   Tuberulin Old Human [Old Tuberculin]     Severe allergic skin reaction   Wellbutrin [Bupropion Hcl] Hives    Family History  Problem Relation Age of Onset   Ovarian cancer Mother    Hypertension Father    Dementia Father       Prior to Admission medications   Medication Sig Start Date End Date Taking? Authorizing Provider  DULoxetine  (CYMBALTA ) 30 MG capsule Take 1 capsule (30 mg total) by mouth at bedtime. 11/01/23     ezetimibe  (ZETIA ) 10 MG tablet Take 1 tablet (10 mg total) by mouth daily. 08/29/23     hydrOXYzine  (VISTARIL ) 25 MG capsule Take 1-2 capsules (25-50 mg total) by mouth at bedtime. 11/24/23  ibuprofen (ADVIL) 200 MG tablet Take 800 mg by mouth every 8 (eight) hours as needed (pain).    [provider]  OVER THE COUNTER MEDICATION Vit D 3 with K2 once per day  Dim 300 mg daily  Citrus bergamot 1000 mg daily  Magnesium Qhs    [provider]  Polyethyl Glycol-Propyl Glycol (SYSTANE) 0.4-0.3 % SOLN Place 1 drop into both eyes in the morning, at noon, and at bedtime.    [provider]  progesterone  (PROMETRIUM ) 200 MG capsule Take 1 capsule (200 mg  total) by mouth daily. 06/21/23     terbinafine  (LAMISIL ) 250 MG tablet Take 1 tablet (250 mg total) by mouth daily. 09/16/23   Magdalen Pasco RAMAN, DPM  thyroid  (NP THYROID ) 15 MG tablet Take 1 tablet (15 mg total) by mouth in the morning on an empty stomach. Wait 30 mins for any other food, drink, or meds. 04/28/22       Physical Exam: BP 107/67   Pulse 75   Temp 98.5 F (36.9 C) (Oral)   Resp 12   Ht 5' 4 (1.626 m)   Wt 65.8 kg   SpO2 97%   BMI 24.90 kg/m   General: 53 y.o. year-old female well developed well nourished in no acute distress.  Alert and oriented x3. Cardiovascular: Regular rate and rhythm with no rubs or gallops.  No thyromegaly or JVD noted.  No lower extremity edema. 2/4 pulses in all 4 extremities. Respiratory: Clear to auscultation with no wheezes or rales. Good inspiratory effort. Abdomen: Soft diffuse tenderness nondistended with normal bowel sounds x4 quadrants. Muskuloskeletal: No cyanosis, clubbing or edema noted bilaterally Neuro: CN II-XII intact, strength, sensation, reflexes Skin: No ulcerative lesions noted or rashes Psychiatry: Judgement and insight appear normal. Mood is appropriate for condition and setting          Labs on Admission:  Basic Metabolic Panel: Recent Labs  Lab 12/22/23 1728  NA 138  K 4.0  CL 103  CO2 20*  GLUCOSE 105*  BUN 9  CREATININE 0.73  CALCIUM  9.0   Liver Function Tests: Recent Labs  Lab 12/22/23 1728  AST 24  ALT 20  ALKPHOS 64  BILITOT 0.5  PROT 7.1  ALBUMIN 4.4   Recent Labs  Lab 12/22/23 1728  LIPASE 28   No results for input(s): AMMONIA in the last 168 hours. CBC: Recent Labs  Lab 12/22/23 1728  WBC 14.2*  HGB 14.2  HCT 40.9  MCV 89.3  PLT 409*   Cardiac Enzymes: No results for input(s): CKTOTAL, CKMB, CKMBINDEX, TROPONINI in the last 168 hours.  BNP (last 3 results) No results for input(s): BNP in the last 8760 hours.  ProBNP (last 3 results) No results for input(s):  PROBNP in the last 8760 hours.  CBG: No results for input(s): GLUCAP in the last 168 hours.  Radiological Exams on Admission: CT ABDOMEN PELVIS W CONTRAST Result Date: 12/22/2023 CLINICAL DATA:  Abdominal pain, acute, nonlocalized EXAM: CT ABDOMEN AND PELVIS WITH CONTRAST TECHNIQUE: Multidetector CT imaging of the abdomen and pelvis was performed using the standard protocol following bolus administration of intravenous contrast. RADIATION DOSE REDUCTION: This exam was performed according to the departmental dose-optimization program which includes automated exposure control, adjustment of the mA and/or kV according to patient size and/or use of iterative reconstruction technique. CONTRAST:  OMNIPAQUE  IOHEXOL  300 MG/ML  SOLN COMPARISON:  December 15, 2023 FINDINGS: Lower chest: No focal airspace consolidation or pleural effusion. Hepatobiliary: No mass.Cholecystectomy.  Mild dilation of the intrahepatic and extrahepatic bile ducts, likely related to the prior cholecystectomy. The portal veins are patent. Pancreas: No mass or main ductal dilation. No peripancreatic inflammation or fluid collection. Spleen: Normal size. No mass. Adrenals/Urinary Tract: No adrenal masses. No renal mass. No nephrolithiasis or hydronephrosis. Partially distended urinary bladder without visualized abnormality. Stomach/Bowel: The stomach is decompressed without focal abnormality. No small bowel wall thickening or inflammation. No small bowel obstruction.Normal appendix. Moderate wall thickening of the descending colon with pericolonic inflammation. Vascular/Lymphatic: No aortic aneurysm. No intraabdominal or pelvic lymphadenopathy. Reproductive: Age-related atrophy of the uterus and ovaries. No concerning adnexal mass.No free pelvic fluid. Other: No pneumoperitoneum or ascites. Musculoskeletal: No acute fracture or destructive lesion. 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 IMPRESSION: Findings consistent with an infectious or  inflammatory colitis of the descending colon. Electronically Signed   By: Rogelia Myers M.D.   On: 12/22/2023 21:40    EKG: I independently viewed the EKG done and my findings are as followed: None available at the time of this visit.  Assessment/Plan Present on Admission:  Colitis  Principal Problem:   Colitis  Colitis, suspected bacterial, POA CT abdomen and pelvis with contrast revealed findings consistent with an infectious or inflammatory colitis of the descending colon. Presented with WBC 14.2 K, bloody stools x 6 at home, recurred in the ER. Exposure to NSAIDs, states uses ibuprofen occasionally for headaches. Added IV PPI Protonix  twice daily Started on IV ciprofloxacin  and IV Flagyl  in the ER, continue for now. Monitor fever curve and WBCs Pain control as needed IV fluid hydration, LR at 100 cc/h x 1 day. Consider GI consultation in the morning  Lower GI bleed in the setting of the above Endorses hematochezia x 6 prior to presentation to the ER 1 episode of bloody stool in the ER. Avoid NSAIDs Hemoglobin on presentation 14.2 Serial H&H every 6 hours x 2. GI, Dr. Deatrice consulted. Could not exclude upper GI bleed with diffuse abdominal pain and recent use of NSAIDs, IV PPI Protonix  twice daily added.  Non anion gap metabolic acidosis Serum bicarb 20, anion gap of 14 Continue gentle IV fluid hydration with LR at 100 cc/h x 1 day Repeat BMP in the morning.  Prior history of lower GI bleed from internal and external hemorrhoids, seen on colonoscopy done in 2022 Advised to avoid the use of NSAIDs  IBS-constipation predominant Resume home regimen  Chronic anxiety/depression Resume home duloxetine   Hyperlipidemia Resume home Zetia   Prior use of thyroid  hormone Prescribed in April 2024 The patient is unsure if she has hypothyroidism Check TSH   Time: 75 minutes.   DVT prophylaxis: SCDs.  Code Status: Full code.  Family Communication: None at  bedside.  Disposition Plan: Admitted to telemetry unit.  Consults called: GI, Dr. Deatrice, consulted.  Admission status: Inpatient status.   Status is: Inpatient The patient requires at least 2 midnights for further evaluation and treatment of present condition.   Terry LOISE Hurst MD [facility] Pager [phone]  If 7PM-7AM, please contact night-coverage [email or web address] Password [facility]  12/22/2023, 11:49 PM

## 2023-12-22 NOTE — ED Triage Notes (Signed)
 Pt arrived via POV c/o generalized abdominal cramping since last night and reports apprx 6-8 episodes of bloody diarrhea. Pt denies blood thinners, denies frequent NSAIDS use.

## 2023-12-23 DIAGNOSIS — K581 Irritable bowel syndrome with constipation: Secondary | ICD-10-CM

## 2023-12-23 DIAGNOSIS — K649 Unspecified hemorrhoids: Secondary | ICD-10-CM

## 2023-12-23 DIAGNOSIS — K529 Noninfective gastroenteritis and colitis, unspecified: Secondary | ICD-10-CM | POA: Diagnosis not present

## 2023-12-23 DIAGNOSIS — R197 Diarrhea, unspecified: Secondary | ICD-10-CM

## 2023-12-23 DIAGNOSIS — K625 Hemorrhage of anus and rectum: Secondary | ICD-10-CM

## 2023-12-23 LAB — HEMOGLOBIN AND HEMATOCRIT, BLOOD
HCT: 33.4 % — ABNORMAL LOW (ref 36.0–46.0)
Hemoglobin: 11.3 g/dL — ABNORMAL LOW (ref 12.0–15.0)

## 2023-12-23 LAB — CBC
HCT: 35.8 % — ABNORMAL LOW (ref 36.0–46.0)
Hemoglobin: 12 g/dL (ref 12.0–15.0)
MCH: 30.8 pg (ref 26.0–34.0)
MCHC: 33.5 g/dL (ref 30.0–36.0)
MCV: 92 fL (ref 80.0–100.0)
Platelets: 321 K/uL (ref 150–400)
RBC: 3.89 MIL/uL (ref 3.87–5.11)
RDW: 13.2 % (ref 11.5–15.5)
WBC: 12.2 K/uL — ABNORMAL HIGH (ref 4.0–10.5)
nRBC: 0 % (ref 0.0–0.2)

## 2023-12-23 LAB — BASIC METABOLIC PANEL WITH GFR
Anion gap: 7 (ref 5–15)
BUN: 8 mg/dL (ref 6–20)
CO2: 27 mmol/L (ref 22–32)
Calcium: 8.3 mg/dL — ABNORMAL LOW (ref 8.9–10.3)
Chloride: 103 mmol/L (ref 98–111)
Creatinine, Ser: 0.87 mg/dL (ref 0.44–1.00)
GFR, Estimated: >60 mL/min (ref >60)
Glucose, Bld: 96 mg/dL (ref 70–99)
Potassium: 3.5 mmol/L (ref 3.5–5.1)
Sodium: 137 mmol/L (ref 135–145)

## 2023-12-23 LAB — PHOSPHORUS: Phosphorus: 3.1 mg/dL (ref 2.5–4.6)

## 2023-12-23 LAB — HIV ANTIBODY (ROUTINE TESTING W REFLEX): HIV Screen 4th Generation wRfx: NONREACTIVE

## 2023-12-23 LAB — TSH: TSH: 4.09 u[IU]/mL (ref 0.350–4.500)

## 2023-12-23 LAB — MAGNESIUM: Magnesium: 2.2 mg/dL (ref 1.7–2.4)

## 2023-12-23 MED ORDER — LACTATED RINGERS IV SOLN: INTRAVENOUS | Status: AC

## 2023-12-23 MED ORDER — POLYVINYL ALCOHOL 1.4 % OP SOLN
1.0000 [drp] | Freq: Three times a day (TID) | OPHTHALMIC | Status: DC
  Administered 2023-12-23 – 2023-12-24 (×3): 1 [drp] via OPHTHALMIC
  Filled 2023-12-23 (×2): qty 15

## 2023-12-23 MED ORDER — DULOXETINE HCL 30 MG PO CPEP
30.0000 mg | ORAL_CAPSULE | Freq: Every day | ORAL | Status: DC
  Administered 2023-12-23: 30 mg via ORAL
  Filled 2023-12-23: qty 1

## 2023-12-23 MED ORDER — THYROID 30 MG PO TABS: 15.0000 mg | ORAL_TABLET | Freq: Every morning | ORAL | Status: DC

## 2023-12-23 MED ORDER — HYDROMORPHONE HCL 1 MG/ML IJ SOLN
0.5000 mg | INTRAMUSCULAR | Status: AC
  Administered 2023-12-23: 0.5 mg via INTRAVENOUS
  Filled 2023-12-23: qty 0.5

## 2023-12-23 MED ORDER — HYDROXYZINE HCL 25 MG PO TABS
50.0000 mg | ORAL_TABLET | Freq: Every day | ORAL | Status: DC
  Administered 2023-12-23: 50 mg via ORAL
  Filled 2023-12-23: qty 2

## 2023-12-23 MED ORDER — PANTOPRAZOLE SODIUM 40 MG IV SOLR
40.0000 mg | Freq: Two times a day (BID) | INTRAVENOUS | Status: DC
  Administered 2023-12-23 – 2023-12-24 (×3): 40 mg via INTRAVENOUS
  Filled 2023-12-23 (×3): qty 10

## 2023-12-23 MED ORDER — HYDROMORPHONE HCL 1 MG/ML IJ SOLN
0.5000 mg | INTRAMUSCULAR | Status: DC | PRN
  Administered 2023-12-23 (×3): 0.5 mg via INTRAVENOUS
  Filled 2023-12-23 (×3): qty 0.5

## 2023-12-23 MED ORDER — EZETIMIBE 10 MG PO TABS
10.0000 mg | ORAL_TABLET | Freq: Every day | ORAL | Status: DC
  Filled 2023-12-23 (×2): qty 1

## 2023-12-23 MED ORDER — CIPROFLOXACIN IN D5W 400 MG/200ML IV SOLN
400.0000 mg | Freq: Two times a day (BID) | INTRAVENOUS | Status: DC
  Administered 2023-12-23 – 2023-12-24 (×3): 400 mg via INTRAVENOUS
  Filled 2023-12-23 (×3): qty 200

## 2023-12-23 MED ORDER — OXYCODONE HCL 5 MG PO TABS
5.0000 mg | ORAL_TABLET | ORAL | Status: DC | PRN
  Administered 2023-12-23: 10 mg via ORAL
  Filled 2023-12-23: qty 2

## 2023-12-23 MED ORDER — METRONIDAZOLE 500 MG/100ML IV SOLN
500.0000 mg | Freq: Two times a day (BID) | INTRAVENOUS | Status: DC
  Administered 2023-12-23 – 2023-12-24 (×3): 500 mg via INTRAVENOUS
  Filled 2023-12-23 (×3): qty 100

## 2023-12-23 NOTE — Hospital Course (Signed)
 53 y.o. female with medical history significant for internal hemorrhoids, IBS-constipation predominant, hyperlipidemia, chronic anxiety/depression, internal and external hemorrhoids, prior history of GI bleed after prolonged exposure to NSAIDs, who presents to the ER with complaints of abdominal pain associated with bloody stools x 6 last night.  Denies use of blood thinners.  Admits to occasional use of NSAIDs, ibuprofen for headaches.  No reported subjective fevers.  Has had chills.   In the ER, vital signs are stable.  Lab studies notable for leukocytosis WBC 14.2 K, serum bicarb 20.  CT abdomen and pelvis with contrast revealed findings consistent with an infectious or inflammatory colitis of the descending colon.   Had another episode of bloody stool in the ER.  The patient was started on IV ciprofloxacin  and IV Flagyl .  Additionally she received IV opiate based analgesics.  TRH, hospitalist service, was asked to admit.   ED Course: Temperature 97.7.  BP 111/80, pulse 76, respiration rate 18, O2 saturation 97% on room air.

## 2023-12-23 NOTE — Plan of Care (Signed)

## 2023-12-23 NOTE — Progress Notes (Signed)
   12/23/23 0807  TOC Brief Assessment  Insurance and Status Reviewed  Patient has primary care physician Yes  Home environment has been reviewed Home with spouse  Prior level of function: Independent  Prior/Current Home Services No current home services  Social Drivers of Health Review SDOH reviewed no interventions necessary  Readmission risk has been reviewed Yes  Transition of care needs no transition of care needs at this time   Inpatient Care Manager (ICM) has reviewed patient and no ICM needs have been identified at this time. We will continue to monitor patient advancement through interdisciplinary progression rounds. If new patient transition needs arise, please place a ICM consult.

## 2023-12-23 NOTE — Consult Note (Signed)
 Gastroenterology Consult   Referring Provider: No ref. provider found Primary Care Physician:  Shona Norleen PEDLAR, MD Primary Gastroenterologist: Toribio Rubins, MD  Patient ID: Kathleen Price; [id number]; May 27, 1970   Admit date: 12/22/2023  LOS: 1 day   Date of Consultation: 12/23/2023  Reason for Consultation:  colitis and rectal bleeding  History of Present Illness   Kathleen Price is a 53 y.o. year old female with history of IBS-C, hemorrhoids, colon polyps, and anxiety with recent ED visit 11/27 for abdominal pain and low back pain with fairly unremarkable CT other than GYN findings and discharged in stable condition who presented to the ED 12/22/23 with 24 hours of abdominal cramping and bloody diarrhea. GI consulted for further evaluation given repeat imaging findings of colitis.   ED Course: Labs - Hgb 14.2, WBC 14.2, CMP unremarkable other than mildly elevated glucose. Lipase 28. UA negative.  CT A/P with contrast - mild dilation of intrahepatic ans extrahepatic bile ducts liekly c/w cholecystectomy state, partially distended bladder, stomach decompressed, moderate wall thickening of descending colon with pericolonic inflammation. Impression: c/w infectious or inflammatory colitis Received dilaudid  for pain, Zofran  for nausea, and started on Cipro  and flagyl .   Consult:  Patient reported that last week she had an episode of abdominal pain which sent her to the ER and things did start to improve but never completely went away and that pain was more on her right side of her abdomen.  Had a meal at a steak house on Monday but has not eaten anything recently that is out of her ordinary.  She reports that Tuesday afternoon she began having abdominal pain to where she was doubled over about 30 minutes after eating a meal while at work and it was sharp for some time but then became a dull ache and then she began having diarrhea.  Her first few episodes of diarrhea did not contain  any blood after this she began having bloody stools.  After she presented to the ED she had 1 episode of a bowel movement which was only blood.  She did report some possible chills Tuesday/Wednesday but unaware of any overt fever given she does not have a thermometer at home.  Has noted a 10 pound weight loss at home over the last several months secondary to decreased appetite.  Has not had any further episodes of diarrhea since admission to the hospital and no further rectal bleeding.  Does note trouble with hemorrhoids in the past.  Denies any overt constipation or other changes in bowel habits prior to her last outpatient visit and hospitalization.  She does report some mild soreness to her abdomen still but she is not having any significant pain since being started on antibiotics.  She denied any family history of IBD.  ED visit 12/15/23 for abdominal and lower back pain. CT performed and was discharged with instructions to follow up with GI.   CT A/P with contrast 12/15/23 IMPRESSION: - No evidence of appendicitis or other acute findings. 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 Suspected small anterior uterine fibroid. - No inflammatory process within the stomach or bowel.  - Evidence of cholecystectomy  Last seen in the office on 11/11/22 by Dr. Eartha. Had recently taken meloxicam  for feet pain and after 2 weeks she began to have rectal bleeding every time she had a BM. Had not tried topic measures. Moving bowels 1-2 times daily and not taking Linzess , constipation improved since starting hormone replacement. Had  2 weeks heartburn and tried tums but recently started otc tums. Denied n/v, dysphagia, weight loss, etc. Advised hydrocortisone  suppositories BID for 7 days, if persistent rectal bleeding the recommended hemorrhoid banding.   Last Colonoscopy: 04/01/2020 - Hemorrhoids found on perianal exam.  - Three 4 to 6 mm polyps in the transverse colon, removed with a cold  snare. - One 2 mm polyp in the transverse colon, removed with a cold biopsy forceps. - One 2 mm polyp in the sigmoid colon, removed with a cold snare. - Non- bleeding external internal hemorrhoids - Path: 3 tubular adenomas and 2 benign polypoid tissue - Repeat Colonoscopy in 5 years.   Past Medical History:  Diagnosis Date   Anxiety    Headache(784.0)    IBS (irritable bowel syndrome)     Past Surgical History:  Procedure Laterality Date   ABLATION     AGILE CAPSULE N/A 07/26/2016   Procedure: AGILE CAPSULE;  Surgeon: Golda Claudis PENNER, MD;  Location: [facility];  Service: Endoscopy;  Laterality: N/A;   CESAREAN SECTION     CHOLECYSTECTOMY     COLONOSCOPY WITH PROPOFOL  N/A 04/01/2020   Procedure: COLONOSCOPY WITH PROPOFOL ;  Surgeon: Eartha Angelia Sieving, MD;  Location: [facility];  Service: Gastroenterology;  Laterality: N/A;  am   GIVENS CAPSULE STUDY N/A 08/02/2016   Procedure: GIVENS CAPSULE STUDY;  Surgeon: Golda Claudis PENNER, MD;  Location: [facility];  Service: Endoscopy;  Laterality: N/A;   POLYPECTOMY  04/01/2020   Procedure: POLYPECTOMY;  Surgeon: Eartha Angelia Sieving, MD;  Location: [facility];  Service: Gastroenterology;;   TUBAL LIGATION      Prior to Admission medications   Medication Sig Start Date End Date Taking? Authorizing Provider  DULoxetine  (CYMBALTA ) 30 MG capsule Take 1 capsule (30 mg total) by mouth at bedtime. 11/01/23     ezetimibe  (ZETIA ) 10 MG tablet Take 1 tablet (10 mg total) by mouth daily. 08/29/23     hydrOXYzine  (VISTARIL ) 25 MG capsule Take 1-2 capsules (25-50 mg total) by mouth at bedtime. 11/24/23     ibuprofen (ADVIL) 200 MG tablet Take 800 mg by mouth every 8 (eight) hours as needed (pain).    [provider]  OVER THE COUNTER MEDICATION Vit D 3 with K2 once per day  Dim 300 mg daily  Citrus bergamot 1000 mg daily  Magnesium Qhs    [provider]  Polyethyl Glycol-Propyl Glycol (SYSTANE) 0.4-0.3 % SOLN  Place 1 drop into both eyes in the morning, at noon, and at bedtime.    [provider]  progesterone  (PROMETRIUM ) 200 MG capsule Take 1 capsule (200 mg total) by mouth daily. 06/21/23     terbinafine  (LAMISIL ) 250 MG tablet Take 1 tablet (250 mg total) by mouth daily. 09/16/23   Magdalen Pasco RAMAN, DPM  thyroid  (NP THYROID ) 15 MG tablet Take 1 tablet (15 mg total) by mouth in the morning on an empty stomach. Wait 30 mins for any other food, drink, or meds. 04/28/22       Current Facility-Administered Medications  Medication Dose Route Frequency Provider Last Rate Last Admin   acetaminophen  (TYLENOL ) tablet 500 mg  500 mg Oral Q6H PRN Hall, Carole N, DO   500 mg at 12/23/23 1015   ciprofloxacin  (CIPRO ) IVPB 400 mg  400 mg Intravenous Q12H Shona Laurence N, DO 200 mL/hr at 12/23/23 0836 400 mg at 12/23/23 0836   DULoxetine  (CYMBALTA ) DR capsule 30 mg  30 mg Oral QHS Hall,  Terry SAILOR, DO       ezetimibe  (ZETIA ) tablet 10 mg  10 mg Oral Daily Hall, Carole N, DO       HYDROmorphone  (DILAUDID ) injection 0.5 mg  0.5 mg Intravenous Q4H PRN Shona Terry N, DO   0.5 mg at 12/23/23 9267   lactated ringers  infusion   Intravenous Continuous Shona Terry SAILOR, DO 100 mL/hr at 12/23/23 0259 New Bag at 12/23/23 0259   melatonin tablet 6 mg  6 mg Oral QHS PRN Shona Terry SAILOR, DO   6 mg at 12/23/23 9692   metroNIDAZOLE  (FLAGYL ) IVPB 500 mg  500 mg Intravenous Q12H Shona Terry SAILOR, DO 100 mL/hr at 12/23/23 1012 500 mg at 12/23/23 1012   oxyCODONE  (Oxy IR/ROXICODONE ) immediate release tablet 5-10 mg  5-10 mg Oral Q4H PRN Shona Terry SAILOR, DO       pantoprazole  (PROTONIX ) injection 40 mg  40 mg Intravenous BID Shona Terry N, DO   40 mg at 12/23/23 0502   polyethylene glycol (MIRALAX  / GLYCOLAX ) packet 17 g  17 g Oral Daily PRN Shona Terry SAILOR, DO       prochlorperazine  (COMPAZINE ) injection 5 mg  5 mg Intravenous Q6H PRN Shona Terry N, DO        Allergies as of 12/22/2023 - Review Complete 12/22/2023  Allergen Reaction  Noted   Morphine  and codeine Other (See Comments) 08/02/2016   Sulfa antibiotics Nausea Only 10/19/2018   Tuberulin old human [old tuberculin]  06/08/2016   Wellbutrin [bupropion hcl] Hives 02/28/2011    Family History  Problem Relation Age of Onset   Ovarian cancer Mother    Hypertension Father    Dementia Father     Social History   Socioeconomic History   Marital status: Married    Spouse name: Not on file   Number of children: Not on file   Years of education: Not on file   Highest education level: Not on file  Occupational History   Not on file  Tobacco Use   Smoking status: Every Day    Current packs/day: 0.50    Average packs/day: 0.5 packs/day for 25.0 years (12.5 ttl pk-yrs)    Types: Cigarettes    Passive exposure: Current   Smokeless tobacco: Never  Vaping Use   Vaping status: Never Used  Substance and Sexual Activity   Alcohol  use: No   Drug use: No   Sexual activity: Yes    Birth control/protection: Surgical  Other Topics Concern   Not on file  Social History Narrative   Not on file   Social Drivers of Health   Financial Resource Strain: Not on file  Food Insecurity: No Food Insecurity (12/23/2023)   Hunger Vital Sign    Worried About Running Out of Food in the Last Year: Never true    Ran Out of Food in the Last Year: Never true  Transportation Needs: No Transportation Needs (12/23/2023)   PRAPARE - Administrator, Civil Service (Medical): No    Lack of Transportation (Non-Medical): No  Physical Activity: Not on file  Stress: Not on file  Social Connections: Not on file  Intimate Partner Violence: Not At Risk (12/23/2023)   Humiliation, Afraid, Rape, and Kick questionnaire    Fear of Current or Ex-Partner: No    Emotionally Abused: No    Physically Abused: No    Sexually Abused: No     Review of Systems   Gen: + weight loss and decreased appetite. +  chills PTA. Denies any fever.  CV: Denies chest pain, heart palpitations,  syncope, edema  Resp: Denies shortness of breath with rest, cough, wheezing, coughing up blood, and pleurisy. GI: see HPI.  Derm: Denies rash, itching, dry skin, hives. Psych: Denies depression, anxiety, memory loss, hallucinations, and confusion. Heme: Denies bruising or bleeding Neuro:  Denies any headaches, dizziness, paresthesias, shaking  Physical Exam   Vital Signs in last 24 hours: Temp:  [97.7 F (36.5 C)-98.7 F (37.1 C)] 98.6 F (37 C) (12/05 0427) Pulse Rate:  [69-77] 69 (12/05 0427) Resp:  [12-20] 16 (12/04 2356) BP: (87-135)/(54-85) 92/65 (12/05 0429) SpO2:  [96 %-98 %] 96 % (12/05 0427) Weight:  [65.8 kg] 65.8 kg (12/04 1715) Last BM Date : 12/22/23  General:   Alert,  Well-developed, well-nourished, pleasant and cooperative in NAD Head:  Normocephalic and atraumatic. Eyes:  Sclera clear, no icterus.   Conjunctiva pink. Ears:  Normal auditory acuity. Mouth:  No deformity or lesions, dentition normal. Neck:  Supple; no masses Abdomen:  Soft, nondistended. Mild TTP across lower abdomen and entire left side of abdomen. no masses, hepatosplenomegaly or hernias noted. Normal bowel sounds, without guarding, and without rebound.   Rectal: deferred   Msk:  Symmetrical without gross deformities. Normal posture. Extremities:  Without clubbing or edema. Neurologic:  Alert and  oriented x4. Skin:  Intact without significant lesions or rashes. Psych:  Alert and cooperative. Normal mood and affect.  Intake/Output from previous day: 12/04 0701 - 12/05 0700 In: 1000 [IV Piggyback:1000] Out: -  Intake/Output this shift: No intake/output data recorded.   Labs/Studies   Recent Labs Recent Labs    12/22/23 1728 12/23/23 0444  WBC 14.2* 12.2*  HGB 14.2 12.0  HCT 40.9 35.8*  PLT 409* 321   BMET Recent Labs    12/22/23 1728 12/23/23 0444  NA 138 137  K 4.0 3.5  CL 103 103  CO2 20* 27  GLUCOSE 105* 96  BUN 9 8  CREATININE 0.73 0.87  CALCIUM  9.0 8.3*    LFT Recent Labs    12/22/23 1728  PROT 7.1  ALBUMIN 4.4  AST 24  ALT 20  ALKPHOS 64  BILITOT 0.5   PT/INR No results for input(s): LABPROT, INR in the last 72 hours. Hepatitis Panel No results for input(s): HEPBSAG, HCVAB, HEPAIGM, HEPBIGM in the last 72 hours. C-Diff No results for input(s): CDIFFTOX in the last 72 hours.  Radiology/Studies CT ABDOMEN PELVIS W CONTRAST Result Date: 12/22/2023 CLINICAL DATA:  Abdominal pain, acute, nonlocalized EXAM: CT ABDOMEN AND PELVIS WITH CONTRAST TECHNIQUE: Multidetector CT imaging of the abdomen and pelvis was performed using the standard protocol following bolus administration of intravenous contrast. RADIATION DOSE REDUCTION: This exam was performed according to the departmental dose-optimization program which includes automated exposure control, adjustment of the mA and/or kV according to patient size and/or use of iterative reconstruction technique. CONTRAST:  OMNIPAQUE  IOHEXOL  300 MG/ML  SOLN COMPARISON:  December 15, 2023 FINDINGS: Lower chest: No focal airspace consolidation or pleural effusion. Hepatobiliary: No mass.Cholecystectomy. Mild dilation of the intrahepatic and extrahepatic bile ducts, likely related to the prior cholecystectomy. The portal veins are patent. Pancreas: No mass or main ductal dilation. No peripancreatic inflammation or fluid collection. Spleen: Normal size. No mass. Adrenals/Urinary Tract: No adrenal masses. No renal mass. No nephrolithiasis or hydronephrosis. Partially distended urinary bladder without visualized abnormality. Stomach/Bowel: The stomach is decompressed without focal abnormality. No small bowel wall thickening or inflammation. No small bowel obstruction.Normal appendix.  Moderate wall thickening of the descending colon with pericolonic inflammation. Vascular/Lymphatic: No aortic aneurysm. No intraabdominal or pelvic lymphadenopathy. Reproductive: Age-related atrophy of the uterus and  ovaries. No concerning adnexal mass.No free pelvic fluid. Other: No pneumoperitoneum or ascites. Musculoskeletal: No acute fracture or destructive lesion. 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 IMPRESSION: Findings consistent with an infectious or inflammatory colitis of the descending colon. Electronically Signed   By: Rogelia Myers M.D.   On: 12/22/2023 21:40     Assessment   Kathleen Price is a 53 y.o. year old female with history of IBS-C, hemorrhoids, colon polyps, and anxiety with recent ED visit 11/27 for abdominal pain and low back pain with fairly unremarkable CT other than GYN findings and discharged in stable condition who presented to the ED 12/22/23 with 24 hours of abdominal cramping and bloody diarrhea. GI consulted for further evaluation given repeat imaging findings of colitis.   Colitis of the descending colon, abdominal pain and bloody diarrhea: - CT imaging this admission with moderate wall thickening of the descending colon with pericolonic inflammation consistent with infectious or inflammatory colitis - In the setting of some possible chills along with leukocytosis, suspect infectious etiology - Of note there has been a higher incidence in the outpatient setting of GI viruses causing diarrhea - She has no history of hemorrhoids with bleeding in the past with last colonoscopy in 2022 and has had a history of rectal bleeding of hemorrhoids with increased bowel movements. - Suspect bleeding could be secondary to mild colitis however could also be hemorrhoidal in the setting of diarrhea given symptoms have subsided - She has had noted improvement in abdominal pain since starting Cipro  and Flagyl  - She has been afebrile and her white count is trending down today - No stool studies were collected in the ED - For further evaluation we will check stool studies and change antibiotic regimen if needed.  Continue to monitor for improvement of symptoms and resolution of leukocytosis.   Will focus on supportive treatment for now along with antibiotics as long as she continues to improve.  We discussed the possible need for follow-up colonoscopy after improvement of acute illness pending her clinical course. - Will treat for possible hemorrhoidal bleeding with Anusol  twice daily for at least 1 week  Plan / Recommendations   Supportive measures, advance diet Continue antibiotics Follow-up stool studies Anusol  twice daily for hemorrhoids Further recommendations to follow pending clinical course, will need to see in follow-up outpatient   12/23/2023, 10:46 AM  Charmaine Melia, MSN, FNP-BC, AGACNP-BC [facility]

## 2023-12-23 NOTE — Plan of Care (Signed)
   Problem: Education: Goal: Knowledge of General Education information will improve Description: Including pain rating scale, medication(s)/side effects and non-pharmacologic comfort measures Outcome: Progressing   Problem: Clinical Measurements: Goal: Will remain free from infection Outcome: Progressing Goal: Diagnostic test results will improve Outcome: Progressing

## 2023-12-23 NOTE — Plan of Care (Signed)

## 2023-12-23 NOTE — Progress Notes (Signed)
 PROGRESS NOTE   KADIAN BARCELLOS  FMW:[id number] DOB: [birth year] DOA: 12/22/2023 PCP: Shona Norleen PEDLAR, MD   Chief Complaint  Patient presents with   Hematochezia   Level of care: Telemetry  Brief Admission History:  53 y.o. female with medical history significant for internal hemorrhoids, IBS-constipation predominant, hyperlipidemia, chronic anxiety/depression, internal and external hemorrhoids, prior history of GI bleed after prolonged exposure to NSAIDs, who presents to the ER with complaints of abdominal pain associated with bloody stools x 6 last night.  Denies use of blood thinners.  Admits to occasional use of NSAIDs, ibuprofen for headaches.  No reported subjective fevers.  Has had chills.   In the ER, vital signs are stable.  Lab studies notable for leukocytosis WBC 14.2 K, serum bicarb 20.  CT abdomen and pelvis with contrast revealed findings consistent with an infectious or inflammatory colitis of the descending colon.   Had another episode of bloody stool in the ER.  The patient was started on IV ciprofloxacin  and IV Flagyl .  Additionally she received IV opiate based analgesics.  TRH, hospitalist service, was asked to admit.   ED Course: Temperature 97.7.  BP 111/80, pulse 76, respiration rate 18, O2 saturation 97% on room air.   Assessment and Plan:  Colitis, suspected bacterial, POA CT abdomen and pelvis with contrast revealed findings consistent with an infectious or inflammatory colitis of the descending colon. Presented with WBC 14.2 K, bloody stools x 6 at home, recurred in the ER. Exposure to NSAIDs, states uses ibuprofen occasionally for headaches. Continue IV PPI Protonix  twice daily Started on IV ciprofloxacin  and IV Flagyl  in the ER, continue for now. Monitor fever curve and WBCs Pain control as needed IV fluid hydration ordered  GI consultation requested  Leukocytosis -WBC trending down with treatment    Lower GI bleed in the setting of the above Endorses  hematochezia x 6 prior to presentation to the ER 1 episode of bloody stool in the ER. No recurrences so far.  Avoid NSAIDs Hemoglobin on presentation 14.2 Serial H&H every 6 hours x 2. GI team consulted. Could not exclude upper GI bleed with diffuse abdominal pain and recent use of NSAIDs IV PPI Protonix  twice daily added.   Non anion gap metabolic acidosis Serum bicarb 20, anion gap of 14 Continue gentle IV fluid hydration Repeat BMP    Prior history of lower GI bleed from internal and external hemorrhoids, seen on colonoscopy done in 2022 Advised to avoid the use of NSAIDs   IBS-constipation predominant Resume home regimen   Chronic anxiety/depression Resume home duloxetine    Hyperlipidemia Resume home Zetia    Prior use of thyroid  hormone Prescribed in April 2024 The patient is unsure if she has hypothyroidism TSH reassuring at 4.090   DVT prophylaxis: SCDs Code Status: Full  Family Communication:  Disposition: home    Consultants:  GI  Procedures:   Antimicrobials:  Ciprofloxacin  12/4>> Metronidazole  12/4>>   Subjective: Pt reports no further episodes of bleeding since being down in the ED.    Objective: Vitals:   12/22/23 2356 12/23/23 0427 12/23/23 0429 12/23/23 1435  BP: 111/80 (!) 87/54 92/65 101/64  Pulse: 76 69  60  Resp: 16   17  Temp: 97.7 F (36.5 C) 98.6 F (37 C)  98.8 F (37.1 C)  TempSrc: Oral Oral  Oral  SpO2: 97% 96%  97%  Weight:      Height:        Intake/Output Summary (Last 24 hours) at 12/23/2023  1442 Last data filed at 12/22/2023 2135 Gross per 24 hour  Intake 1000 ml  Output --  Net 1000 ml   Filed Weights   12/22/23 1715  Weight: 65.8 kg   Examination:  General exam: Appears calm and comfortable  Respiratory system: Clear to auscultation. Respiratory effort normal. Cardiovascular system: normal S1 & S2 heard. No JVD, murmurs, rubs, gallops or clicks. No pedal edema. Gastrointestinal system: Abdomen is  nondistended, soft and nontender. No organomegaly or masses felt. Normal bowel sounds heard. Central nervous system: Alert and oriented. No focal neurological deficits. Extremities: Symmetric 5 x 5 power. Skin: No rashes, lesions or ulcers. Psychiatry: Judgement and insight appear normal. Mood & affect appropriate.   Data Reviewed: I have personally reviewed following labs and imaging studies  CBC: Recent Labs  Lab 12/22/23 1728 12/23/23 0444  WBC 14.2* 12.2*  HGB 14.2 12.0  HCT 40.9 35.8*  MCV 89.3 92.0  PLT 409* 321    Basic Metabolic Panel: Recent Labs  Lab 12/22/23 1728 12/23/23 0444  NA 138 137  K 4.0 3.5  CL 103 103  CO2 20* 27  GLUCOSE 105* 96  BUN 9 8  CREATININE 0.73 0.87  CALCIUM  9.0 8.3*  MG  --  2.2  PHOS  --  3.1    CBG: No results for input(s): GLUCAP in the last 168 hours.  No results found for this or any previous visit (from the past 240 hours).   Radiology Studies: CT ABDOMEN PELVIS W CONTRAST Result Date: 12/22/2023 CLINICAL DATA:  Abdominal pain, acute, nonlocalized EXAM: CT ABDOMEN AND PELVIS WITH CONTRAST TECHNIQUE: Multidetector CT imaging of the abdomen and pelvis was performed using the standard protocol following bolus administration of intravenous contrast. RADIATION DOSE REDUCTION: This exam was performed according to the departmental dose-optimization program which includes automated exposure control, adjustment of the mA and/or kV according to patient size and/or use of iterative reconstruction technique. CONTRAST:  OMNIPAQUE  IOHEXOL  300 MG/ML  SOLN COMPARISON:  December 15, 2023 FINDINGS: Lower chest: No focal airspace consolidation or pleural effusion. Hepatobiliary: No mass.Cholecystectomy. Mild dilation of the intrahepatic and extrahepatic bile ducts, likely related to the prior cholecystectomy. The portal veins are patent. Pancreas: No mass or main ductal dilation. No peripancreatic inflammation or fluid collection. Spleen: Normal  size. No mass. Adrenals/Urinary Tract: No adrenal masses. No renal mass. No nephrolithiasis or hydronephrosis. Partially distended urinary bladder without visualized abnormality. Stomach/Bowel: The stomach is decompressed without focal abnormality. No small bowel wall thickening or inflammation. No small bowel obstruction.Normal appendix. Moderate wall thickening of the descending colon with pericolonic inflammation. Vascular/Lymphatic: No aortic aneurysm. No intraabdominal or pelvic lymphadenopathy. Reproductive: Age-related atrophy of the uterus and ovaries. No concerning adnexal mass.No free pelvic fluid. Other: No pneumoperitoneum or ascites. Musculoskeletal: No acute fracture or destructive lesion. 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 IMPRESSION: Findings consistent with an infectious or inflammatory colitis of the descending colon. Electronically Signed   By: Rogelia Myers M.D.   On: 12/22/2023 21:40    Scheduled Meds:  DULoxetine   30 mg Oral QHS   ezetimibe   10 mg Oral Daily   pantoprazole  (PROTONIX ) IV  40 mg Intravenous BID   Continuous Infusions:  ciprofloxacin  400 mg (12/23/23 0836)   lactated ringers  100 mL/hr at 12/23/23 1318   metroNIDAZOLE  500 mg (12/23/23 1012)     LOS: 1 day   Time spent: 57 mins  Dajanay Northrup Vicci, MD How to contact the [facility] Attending or Consulting provider 7A - 7P or  covering provider during after hours 7P -7A, for this patient?  Check the care team in [facility] and look for a) attending/consulting TRH provider listed and b) the TRH team listed Log into [email or web address] to find provider on call.  Locate the TRH provider you are looking for under [facility] and page to a number that you can be directly reached. If you still have difficulty reaching the provider, please page the [facility] (Director on Call) for the Hospitalists listed on amion for assistance.  12/23/2023, 2:42 PM

## 2023-12-24 ENCOUNTER — Telehealth (INDEPENDENT_AMBULATORY_CARE_PROVIDER_SITE_OTHER): Payer: Self-pay | Admitting: Gastroenterology

## 2023-12-24 DIAGNOSIS — D72829 Elevated white blood cell count, unspecified: Secondary | ICD-10-CM

## 2023-12-24 DIAGNOSIS — K921 Melena: Secondary | ICD-10-CM | POA: Diagnosis not present

## 2023-12-24 DIAGNOSIS — K529 Noninfective gastroenteritis and colitis, unspecified: Secondary | ICD-10-CM | POA: Diagnosis not present

## 2023-12-24 LAB — BASIC METABOLIC PANEL WITH GFR
Anion gap: 6 (ref 5–15)
BUN: 7 mg/dL (ref 6–20)
CO2: 29 mmol/L (ref 22–32)
Calcium: 8.4 mg/dL — ABNORMAL LOW (ref 8.9–10.3)
Chloride: 105 mmol/L (ref 98–111)
Creatinine, Ser: 0.86 mg/dL (ref 0.44–1.00)
GFR, Estimated: >60 mL/min (ref >60)
Glucose, Bld: 101 mg/dL — ABNORMAL HIGH (ref 70–99)
Potassium: 3.9 mmol/L (ref 3.5–5.1)
Sodium: 139 mmol/L (ref 135–145)

## 2023-12-24 LAB — CBC WITH DIFFERENTIAL/PLATELET
Abs Immature Granulocytes: 0.02 K/uL (ref 0.00–0.07)
Basophils Absolute: 0.1 K/uL (ref 0.0–0.1)
Basophils Relative: 1 %
Eosinophils Absolute: 0.2 K/uL (ref 0.0–0.5)
Eosinophils Relative: 3 %
HCT: 34.1 % — ABNORMAL LOW (ref 36.0–46.0)
Hemoglobin: 11.6 g/dL — ABNORMAL LOW (ref 12.0–15.0)
Immature Granulocytes: 0 %
Lymphocytes Relative: 21 %
Lymphs Abs: 1.7 K/uL (ref 0.7–4.0)
MCH: 31 pg (ref 26.0–34.0)
MCHC: 34 g/dL (ref 30.0–36.0)
MCV: 91.2 fL (ref 80.0–100.0)
Monocytes Absolute: 0.6 K/uL (ref 0.1–1.0)
Monocytes Relative: 8 %
Neutro Abs: 5.5 K/uL (ref 1.7–7.7)
Neutrophils Relative %: 67 %
Platelets: 284 K/uL (ref 150–400)
RBC: 3.74 MIL/uL — ABNORMAL LOW (ref 3.87–5.11)
RDW: 13 % (ref 11.5–15.5)
WBC: 8.1 K/uL (ref 4.0–10.5)
nRBC: 0 % (ref 0.0–0.2)

## 2023-12-24 MED ORDER — CIPROFLOXACIN HCL 500 MG PO TABS
500.0000 mg | ORAL_TABLET | Freq: Two times a day (BID) | ORAL | 0 refills | Status: DC
  Filled 2023-12-24: qty 12, 6d supply, fill #0

## 2023-12-24 MED ORDER — METRONIDAZOLE 500 MG PO TABS
500.0000 mg | ORAL_TABLET | Freq: Two times a day (BID) | ORAL | 0 refills | Status: DC
  Filled 2023-12-24: qty 12, 6d supply, fill #0

## 2023-12-24 MED ORDER — METRONIDAZOLE 500 MG PO TABS: 500.0000 mg | ORAL_TABLET | Freq: Two times a day (BID) | ORAL | 0 refills | Status: AC

## 2023-12-24 MED ORDER — CIPROFLOXACIN HCL 500 MG PO TABS: 500.0000 mg | ORAL_TABLET | Freq: Two times a day (BID) | ORAL | 0 refills | Status: AC

## 2023-12-24 NOTE — Discharge Summary (Signed)
 Physician Discharge Summary  Kathleen Price FMW:[id number] DOB: November 01, 1970 DOA: 12/22/2023  PCP: Shona Norleen PEDLAR, MD GI: [facility]   Admit date: 12/22/2023 Discharge date: 12/24/2023  Admitted From:  Home  Disposition: Home   Recommendations for Outpatient Follow-up:  Follow up with PCP in 1 weeks Follow up with [facility] as scheduled   Home Health: NA  Discharge Condition: STABLE   CODE STATUS: FULL DIET: soft foods, advance diet as tolerated    Brief Hospitalization Summary: Please see all hospital notes, images, labs for full details of the hospitalization. Admission provider HPI:  53 y.o. female with medical history significant for internal hemorrhoids, IBS-constipation predominant, hyperlipidemia, chronic anxiety/depression, internal and external hemorrhoids, prior history of GI bleed after prolonged exposure to NSAIDs, who presents to the ER with complaints of abdominal pain associated with bloody stools x 6 last night.  Denies use of blood thinners.  Admits to occasional use of NSAIDs, ibuprofen for headaches.  No reported subjective fevers.  Has had chills.   In the ER, vital signs are stable.  Lab studies notable for leukocytosis WBC 14.2 K, serum bicarb 20.  CT abdomen and pelvis with contrast revealed findings consistent with an infectious or inflammatory colitis of the descending colon.   Had another episode of bloody stool in the ER.  The patient was started on IV ciprofloxacin  and IV Flagyl .  Additionally she received IV opiate based analgesics.  TRH, hospitalist service, was asked to admit.   ED Course: Temperature 97.7.  BP 111/80, pulse 76, respiration rate 18, O2 saturation 97% on room air.  Hospital Course by listed problems addressed   Colitis, suspected bacterial, POA CT abdomen and pelvis with contrast revealed findings consistent with an infectious or inflammatory colitis of the descending colon. Presented with WBC 14.2 K, bloody stools x 6 at home,  recurred in the ER. Started on IV ciprofloxacin  and IV Flagyl  with good results, resolution of symptoms GI consultation requested, ok to DC home today to complete full 7 day course of cipro /flagyl  Outpatient follow up with [facility] arranged    Leukocytosis -WBC normalized now    Lower GI bleed -- RESOLVED  Endorses hematochezia x 6 prior to presentation to the ER 1 episode of bloody stool in the ER. No recurrences so far.  Avoid NSAIDs Hemoglobin on presentation 14.2 GI team consulted. Recommendation to continue antibiotic treatment and her symptoms have resolved   Non anion gap metabolic acidosis--RESOLVED  Serum bicarb 20, anion gap of 14 Treated with gentle IV fluid hydration Repeat BMP reassuring     Prior history of lower GI bleed from internal and external hemorrhoids, seen on colonoscopy done in 2022 Advised to avoid the use of NSAIDs   IBS-constipation predominant Resume home regimen   Chronic anxiety/depression Resume home duloxetine    Hyperlipidemia Resume home Zetia    Prior use of thyroid  hormone Prescribed in April 2024 The patient is unsure if she has hypothyroidism TSH reassuring at 4.090    Discharge Diagnoses:  Principal Problem:   Colitis   Discharge Instructions:  Allergies as of 12/24/2023       Reactions   Morphine  And Codeine Other (See Comments)   Impending doom feeling paranoia   Sulfa Antibiotics Nausea Only   Tuberulin Old Human [old Tuberculin]    Severe allergic skin reaction   Wellbutrin [bupropion Hcl] Hives        Medication List     STOP taking these medications    ezetimibe  10 MG  tablet Commonly known as: Zetia        TAKE these medications    ciprofloxacin  500 MG tablet Commonly known as: Cipro  Take 1 tablet (500 mg total) by mouth 2 (two) times daily for 6 days.   DULoxetine  30 MG capsule Commonly known as: CYMBALTA  Take 1 capsule (30 mg total) by mouth at bedtime.   hydrOXYzine  25 MG  capsule Commonly known as: VISTARIL  Take 1-2 capsules (25-50 mg total) by mouth at bedtime.   metroNIDAZOLE  500 MG tablet Commonly known as: FLAGYL  Take 1 tablet (500 mg total) by mouth 2 (two) times daily for 6 days.   OVER THE COUNTER MEDICATION Magnesium and vitamin d3   Systane 0.4-0.3 % Soln Generic drug: Polyethyl Glycol-Propyl Glycol Place 1 drop into both eyes in the morning, at noon, and at bedtime.   terbinafine  250 MG tablet Commonly known as: LAMISIL  Take 1 tablet (250 mg total) by mouth daily.        Follow-up Information     Shona Norleen PEDLAR, MD. Schedule an appointment as soon as possible for a visit in 1 week(s).   Specialty: Internal Medicine Why: Hospital Follow Up Contact information: [address] [place] [facility] [address] [phone]         [facility]enter[facility] at [facility]. Schedule an appointment as soon as possible for a visit in 2 week(s).   Specialty: Gastroenterology Why: Hospital Follow Up Contact information: [address] [place]  [address] [phone]               Allergies  Allergen Reactions   Morphine  And Codeine Other (See Comments)    Impending doom feeling paranoia   Sulfa Antibiotics Nausea Only   Tuberulin Old Human [Old Tuberculin]     Severe allergic skin reaction   Wellbutrin [Bupropion Hcl] Hives   Allergies as of 12/24/2023       Reactions   Morphine  And Codeine Other (See Comments)   Impending doom feeling paranoia   Sulfa Antibiotics Nausea Only   Tuberulin Old Human [old Tuberculin]    Severe allergic skin reaction   Wellbutrin [bupropion Hcl] Hives        Medication List     STOP taking these medications    ezetimibe  10 MG tablet Commonly known as: Zetia        TAKE these medications    ciprofloxacin  500 MG tablet Commonly known as: Cipro  Take 1 tablet (500 mg total) by mouth 2 (two) times daily for 6 days.   DULoxetine  30 MG  capsule Commonly known as: CYMBALTA  Take 1 capsule (30 mg total) by mouth at bedtime.   hydrOXYzine  25 MG capsule Commonly known as: VISTARIL  Take 1-2 capsules (25-50 mg total) by mouth at bedtime.   metroNIDAZOLE  500 MG tablet Commonly known as: FLAGYL  Take 1 tablet (500 mg total) by mouth 2 (two) times daily for 6 days.   OVER THE COUNTER MEDICATION Magnesium and vitamin d3   Systane 0.4-0.3 % Soln Generic drug: Polyethyl Glycol-Propyl Glycol Place 1 drop into both eyes in the morning, at noon, and at bedtime.   terbinafine  250 MG tablet Commonly known as: LAMISIL  Take 1 tablet (250 mg total) by mouth daily.        Procedures/Studies: CT ABDOMEN PELVIS W CONTRAST Result Date: 12/22/2023 CLINICAL DATA:  Abdominal pain, acute, nonlocalized EXAM: CT ABDOMEN AND PELVIS WITH CONTRAST TECHNIQUE: Multidetector CT imaging of the abdomen and pelvis was performed using the standard protocol following bolus administration  of intravenous contrast. RADIATION DOSE REDUCTION: This exam was performed according to the departmental dose-optimization program which includes automated exposure control, adjustment of the mA and/or kV according to patient size and/or use of iterative reconstruction technique. CONTRAST:  OMNIPAQUE  IOHEXOL  300 MG/ML  SOLN COMPARISON:  December 15, 2023 FINDINGS: Lower chest: No focal airspace consolidation or pleural effusion. Hepatobiliary: No mass.Cholecystectomy. Mild dilation of the intrahepatic and extrahepatic bile ducts, likely related to the prior cholecystectomy. The portal veins are patent. Pancreas: No mass or main ductal dilation. No peripancreatic inflammation or fluid collection. Spleen: Normal size. No mass. Adrenals/Urinary Tract: No adrenal masses. No renal mass. No nephrolithiasis or hydronephrosis. Partially distended urinary bladder without visualized abnormality. Stomach/Bowel: The stomach is decompressed without focal abnormality. No small bowel  wall thickening or inflammation. No small bowel obstruction.Normal appendix. Moderate wall thickening of the descending colon with pericolonic inflammation. Vascular/Lymphatic: No aortic aneurysm. No intraabdominal or pelvic lymphadenopathy. Reproductive: Age-related atrophy of the uterus and ovaries. No concerning adnexal mass.No free pelvic fluid. Other: No pneumoperitoneum or ascites. Musculoskeletal: No acute fracture or destructive lesion. 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 IMPRESSION: Findings consistent with an infectious or inflammatory colitis of the descending colon. Electronically Signed   By: Rogelia Myers M.D.   On: 12/22/2023 21:40   CT ABDOMEN PELVIS W CONTRAST Result Date: 12/15/2023 CLINICAL DATA:  Worsening right-sided abdominal pain for several weeks. Nausea. Anorexia. EXAM: CT ABDOMEN AND PELVIS WITH CONTRAST TECHNIQUE: Multidetector CT imaging of the abdomen and pelvis was performed using the standard protocol following bolus administration of intravenous contrast. RADIATION DOSE REDUCTION: This exam was performed according to the departmental dose-optimization program which includes automated exposure control, adjustment of the mA and/or kV according to patient size and/or use of iterative reconstruction technique. CONTRAST:  75mL OMNIPAQUE  IOHEXOL  350 MG/ML SOLN COMPARISON:  08/17/2016 FINDINGS: Lower Chest: No acute findings. Hepatobiliary: No suspicious hepatic masses identified. Prior cholecystectomy. No evidence of biliary obstruction. Pancreas:  No mass or inflammatory changes. Spleen: Within normal limits in size and appearance. Adrenals/Urinary Tract: No suspicious masses identified. No evidence of ureteral calculi or hydronephrosis. Unremarkable unopacified urinary bladder. Stomach/Bowel: No evidence of obstruction, inflammatory process or abnormal fluid collections. Normal appendix visualized. Vascular/Lymphatic: No pathologically enlarged lymph nodes. No acute vascular  findings. Reproductive: Uterine adhesion to anterior abdominal wall, likely due to prior C-section. Focal myometrial soft tissue prominence measuring approximately 4 cm in the anterior corpus suspicious for fibroid. Adnexal regions are unremarkable. Other:  None. Musculoskeletal:  No suspicious bone lesions identified. IMPRESSION: No evidence of appendicitis or other acute findings. 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 Suspected small anterior uterine fibroid. Electronically Signed   By: Norleen DELENA Kil M.D.   On: 12/15/2023 09:53     Subjective: Pt says she is feeling much better after receiving the antibiotics, no diarrhea, no rectal bleeding, no emesis and tolerating diet well.    Discharge Exam: Vitals:   12/23/23 2001 12/24/23 0352  BP: 101/65 105/68  Pulse: 73 70  Resp: 18 18  Temp: 99.6 F (37.6 C) 98.5 F (36.9 C)  SpO2: 96% 94%   Vitals:   12/23/23 0429 12/23/23 1435 12/23/23 2001 12/24/23 0352  BP: 92/65 101/64 101/65 105/68  Pulse:  60 73 70  Resp:  17 18 18   Temp:  98.8 F (37.1 C) 99.6 F (37.6 C) 98.5 F (36.9 C)  TempSrc:  Oral Oral Oral  SpO2:  97% 96% 94%  Weight:      Height:  General: Pt is alert, awake, not in acute distress Cardiovascular: RRR, S1/S2 +, no rubs, no gallops Respiratory: CTA bilaterally, no wheezing, no rhonchi Abdominal: Soft, NT, ND, bowel sounds + Extremities: no edema, no cyanosis   The results of significant diagnostics from this hospitalization (including imaging, microbiology, ancillary and laboratory) are listed below for reference.     Microbiology: No results found for this or any previous visit (from the past 240 hours).   Labs: BNP (last 3 results) No results for input(s): BNP in the last 8760 hours. Basic Metabolic Panel: Recent Labs  Lab 12/22/23 1728 12/23/23 0444 12/24/23 0507  NA 138 137 139  K 4.0 3.5 3.9  CL 103 103 105  CO2 20* 27 29  GLUCOSE 105* 96 101*  BUN 9 8 7   CREATININE  0.73 0.87 0.86  CALCIUM  9.0 8.3* 8.4*  MG  --  2.2  --   PHOS  --  3.1  --    Liver Function Tests: Recent Labs  Lab 12/22/23 1728  AST 24  ALT 20  ALKPHOS 64  BILITOT 0.5  PROT 7.1  ALBUMIN 4.4   Recent Labs  Lab 12/22/23 1728  LIPASE 28   No results for input(s): AMMONIA in the last 168 hours. CBC: Recent Labs  Lab 12/22/23 1728 12/23/23 0444 12/23/23 1706 12/24/23 0507  WBC 14.2* 12.2*  --  8.1  NEUTROABS  --   --   --  5.5  HGB 14.2 12.0 11.3* 11.6*  HCT 40.9 35.8* 33.4* 34.1*  MCV 89.3 92.0  --  91.2  PLT 409* 321  --  284   Cardiac Enzymes: No results for input(s): CKTOTAL, CKMB, CKMBINDEX, TROPONINI in the last 168 hours. BNP: Invalid input(s): POCBNP CBG: No results for input(s): GLUCAP in the last 168 hours. D-Dimer No results for input(s): DDIMER in the last 72 hours. Hgb A1c No results for input(s): HGBA1C in the last 72 hours. Lipid Profile No results for input(s): CHOL, HDL, LDLCALC, TRIG, CHOLHDL, LDLDIRECT in the last 72 hours. Thyroid  function studies Recent Labs    12/23/23 0444  TSH 4.090   Anemia work up No results for input(s): VITAMINB12, FOLATE, FERRITIN, TIBC, IRON, RETICCTPCT in the last 72 hours. Urinalysis    Component Value Date/Time   COLORURINE YELLOW 12/22/2023 1718   APPEARANCEUR CLOUDY (A) 12/22/2023 1718   LABSPEC 1.018 12/22/2023 1718   PHURINE 5.0 12/22/2023 1718   GLUCOSEU NEGATIVE 12/22/2023 1718   HGBUR NEGATIVE 12/22/2023 1718   BILIRUBINUR NEGATIVE 12/22/2023 1718   KETONESUR NEGATIVE 12/22/2023 1718   PROTEINUR NEGATIVE 12/22/2023 1718   NITRITE NEGATIVE 12/22/2023 1718   LEUKOCYTESUR NEGATIVE 12/22/2023 1718   Sepsis Labs Recent Labs  Lab 12/22/23 1728 12/23/23 0444 12/24/23 0507  WBC 14.2* 12.2* 8.1   Microbiology No results found for this or any previous visit (from the past 240 hours).  Time coordinating discharge: 32 mins  SIGNED:  Afton Louder, MD  [facility] 12/24/2023, 11:31 AM How to contact the [facility] Attending or Consulting provider 7A - 7P or covering provider during after hours 7P -7A, for this patient?  Check the care team in [facility] and look for a) attending/consulting TRH provider listed and b) the TRH team listed Log into [email or web address] and use [employer] universal password to access. If you do not have the password, please contact the hospital operator. Locate the TRH provider you are looking for under [facility] and page to a number that you can be directly  reached. If you still have difficulty reaching the provider, please page the [facility] (Director on Call) for the Hospitalists listed on amion for assistance.

## 2023-12-24 NOTE — Telephone Encounter (Signed)
 Hi Amalia Badder,  Can you please schedule a follow up appointment for this patient in 2-3 weeks with me or any of the APPs?  Thanks,  Samantha Cress, MD Gastroenterology and Hepatology [facility] Gastroenterology

## 2023-12-24 NOTE — Discharge Instructions (Signed)
 IMPORTANT INFORMATION: PAY CLOSE ATTENTION   PHYSICIAN DISCHARGE INSTRUCTIONS  Follow with Primary care provider  Benita Stabile, MD  and other consultants as instructed by your Hospitalist Physician  SEEK MEDICAL CARE OR RETURN TO EMERGENCY ROOM IF SYMPTOMS COME BACK, WORSEN OR NEW PROBLEM DEVELOPS   Please note: You were cared for by a hospitalist during your hospital stay. Every effort will be made to forward records to your primary care provider.  You can request that your primary care provider send for your hospital records if they have not received them.  Once you are discharged, your primary care physician will handle any further medical issues. Please note that NO REFILLS for any discharge medications will be authorized once you are discharged, as it is imperative that you return to your primary care physician (or establish a relationship with a primary care physician if you do not have one) for your post hospital discharge needs so that they can reassess your need for medications and monitor your lab values.  Please get a complete blood count and chemistry panel checked by your Primary MD at your next visit, and again as instructed by your Primary MD.  Get Medicines reviewed and adjusted: Please take all your medications with you for your next visit with your Primary MD  Laboratory/radiological data: Please request your Primary MD to go over all hospital tests and procedure/radiological results at the follow up, please ask your primary care provider to get all Hospital records sent to his/her office.  In some cases, they will be blood work, cultures and biopsy results pending at the time of your discharge. Please request that your primary care provider follow up on these results.  If you are diabetic, please bring your blood sugar readings with you to your follow up appointment with primary care.    Please call and make your follow up appointments as soon as possible.    Also Note the  following: If you experience worsening of your admission symptoms, develop shortness of breath, life threatening emergency, suicidal or homicidal thoughts you must seek medical attention immediately by calling 911 or calling your MD immediately  if symptoms less severe.  You must read complete instructions/literature along with all the possible adverse reactions/side effects for all the Medicines you take and that have been prescribed to you. Take any new Medicines after you have completely understood and accpet all the possible adverse reactions/side effects.   Do not drive when taking Pain medications or sleeping medications (Benzodiazepines)  Do not take more than prescribed Pain, Sleep and Anxiety Medications. It is not advisable to combine anxiety,sleep and pain medications without talking with your primary care practitioner  Special Instructions: If you have smoked or chewed Tobacco  in the last 2 yrs please stop smoking, stop any regular Alcohol  and or any Recreational drug use.  Wear Seat belts while driving.  Do not drive if taking any narcotic, mind altering or controlled substances or recreational drugs or alcohol.

## 2023-12-24 NOTE — Progress Notes (Signed)
 Toribio Fortune, M.D. Gastroenterology & Hepatology   Interval History:  States abdominal pain has completely resolved and she has not requested any pain medications.  Also reports she has not been able to move her bowels since admission.  No more rectal bleeding. Feeling well overall.  Inpatient Medications:  Current Facility-Administered Medications:    acetaminophen  (TYLENOL ) tablet 500 mg, 500 mg, Oral, Q6H PRN, Shona Laurence N, DO, 500 mg at 12/23/23 1839   artificial tears ophthalmic solution 1 drop, 1 drop, Both Eyes, TID, Johnson, Clanford L, MD, 1 drop at 12/23/23 2116   ciprofloxacin  (CIPRO ) IVPB 400 mg, 400 mg, Intravenous, Q12H, Hall, Carole N, DO, Last Rate: 200 mL/hr at 12/24/23 0904, 400 mg at 12/24/23 0904   DULoxetine  (CYMBALTA ) DR capsule 30 mg, 30 mg, Oral, QHS, Hall, Carole N, DO, 30 mg at 12/23/23 2107   HYDROmorphone  (DILAUDID ) injection 0.5 mg, 0.5 mg, Intravenous, Q4H PRN, Shona, Carole N, DO, 0.5 mg at 12/23/23 1318   hydrOXYzine  (ATARAX ) tablet 50 mg, 50 mg, Oral, QHS, Johnson, Clanford L, MD, 50 mg at 12/23/23 2108   melatonin tablet 6 mg, 6 mg, Oral, QHS PRN, Shona Laurence N, DO, 6 mg at 12/23/23 2107   metroNIDAZOLE  (FLAGYL ) IVPB 500 mg, 500 mg, Intravenous, Q12H, Hall, Carole N, DO, Last Rate: 100 mL/hr at 12/23/23 2113, 500 mg at 12/23/23 2113   oxyCODONE  (Oxy IR/ROXICODONE ) immediate release tablet 5-10 mg, 5-10 mg, Oral, Q4H PRN, Shona Laurence N, DO, 10 mg at 12/23/23 1839   pantoprazole  (PROTONIX ) injection 40 mg, 40 mg, Intravenous, BID, Hall, Carole N, DO, 40 mg at 12/24/23 0859   polyethylene glycol (MIRALAX  / GLYCOLAX ) packet 17 g, 17 g, Oral, Daily PRN, Shona, Carole N, DO   prochlorperazine  (COMPAZINE ) injection 5 mg, 5 mg, Intravenous, Q6H PRN, Shona Laurence SAILOR, DO   I/O    Intake/Output Summary (Last 24 hours) at 12/24/2023 0906 Last data filed at 12/24/2023 0900 Gross per 24 hour  Intake 1608.19 ml  Output --  Net 1608.19 ml     Physical  Exam: Temp:  [98.5 F (36.9 C)-99.6 F (37.6 C)] 98.5 F (36.9 C) (12/06 0352) Pulse Rate:  [60-73] 70 (12/06 0352) Resp:  [17-18] 18 (12/06 0352) BP: (101-105)/(64-68) 105/68 (12/06 0352) SpO2:  [94 %-97 %] 94 % (12/06 0352)  Temp (24hrs), Avg:99 F (37.2 C), Min:98.5 F (36.9 C), Max:99.6 F (37.6 C)  GENERAL: The patient is AO x3, in no acute distress. HEENT: Head is normocephalic and atraumatic. EOMI are intact. Mouth is well hydrated and without lesions. NECK: Supple. No masses LUNGS: Clear to auscultation. No presence of rhonchi/wheezing/rales. Adequate chest expansion HEART: RRR, normal s1 and s2. ABDOMEN: Soft, nontender, no guarding, no peritoneal signs, and nondistended. BS +. No masses. EXTREMITIES: Without any cyanosis, clubbing, rash, lesions or edema. NEUROLOGIC: AOx3, no focal motor deficit. SKIN: no jaundice, no rashes  Laboratory Data: CBC:     Component Value Date/Time   WBC 8.1 12/24/2023 0507   RBC 3.74 (L) 12/24/2023 0507   HGB 11.6 (L) 12/24/2023 0507   HCT 34.1 (L) 12/24/2023 0507   PLT 284 12/24/2023 0507   MCV 91.2 12/24/2023 0507   MCH 31.0 12/24/2023 0507   MCHC 34.0 12/24/2023 0507   RDW 13.0 12/24/2023 0507   LYMPHSABS 1.7 12/24/2023 0507   MONOABS 0.6 12/24/2023 0507   EOSABS 0.2 12/24/2023 0507   BASOSABS 0.1 12/24/2023 0507   COAG: No results found for: INR, PROTIME  BMP:  Latest Ref Rng & Units 12/24/2023    5:07 AM 12/23/2023    4:44 AM 12/22/2023    5:28 PM  BMP  Glucose 70 - 99 mg/dL 898  96  894   BUN 6 - 20 mg/dL 7  8  9    Creatinine 0.44 - 1.00 mg/dL 9.13  9.12  9.26   Sodium 135 - 145 mmol/L 139  137  138   Potassium 3.5 - 5.1 mmol/L 3.9  3.5  4.0   Chloride 98 - 111 mmol/L 105  103  103   CO2 22 - 32 mmol/L 29  27  20    Calcium  8.9 - 10.3 mg/dL 8.4  8.3  9.0     HEPATIC:     Latest Ref Rng & Units 12/22/2023    5:28 PM 12/15/2023    8:27 AM 06/07/2016    8:40 AM  Hepatic Function  Total Protein 6.5 - 8.1 g/dL  7.1  6.2  6.9   Albumin 3.5 - 5.0 g/dL 4.4  3.8  3.8   AST 15 - 41 U/L 24  21  32   ALT 0 - 44 U/L 20  18  30    Alk Phosphatase 38 - 126 U/L 64  47  52   Total Bilirubin 0.0 - 1.2 mg/dL 0.5  0.5  0.7     CARDIAC: No results found for: CKTOTAL, CKMB, CKMBINDEX, TROPONINI    Imaging: I personally reviewed and interpreted the available labs, imaging and endoscopic files.   Assessment/Plan: 53 year old female with history of IBS-C, hemorrhoids and anxiety, who came to the hospital after presenting abdominal pain, diarrhea and rectal bleeding.  Patient had acute onset of abdominal pain and diarrhea with some rectal bleeding.  Given acute onset of symptoms, this was likely related to an acute gastrointestinal infection for which she was started on antibiotics empirically.  Since admission, her symptoms have significantly improved.  She could not collect any stool sample for analysis but given improvement of symptoms, will recommend finishing a 1 week course of antibiotics.  She will need to follow closely with the GI clinic.    -Continue ciprofloxacin  500 mg twice daily and metronidazole  500 mg 3 times daily for a total of 1 week - Follow-up in GI clinic in 2 to 3 weeks. - GI service will sign-off, please call us  back if you have any more questions.  Toribio Fortune, MD Gastroenterology and Hepatology [facility]

## 2023-12-25 ENCOUNTER — Other Ambulatory Visit: Payer: Self-pay

## 2023-12-26 ENCOUNTER — Other Ambulatory Visit (HOSPITAL_COMMUNITY): Payer: Self-pay

## 2023-12-26 DIAGNOSIS — F331 Major depressive disorder, recurrent, moderate: Secondary | ICD-10-CM | POA: Diagnosis not present

## 2023-12-26 DIAGNOSIS — F411 Generalized anxiety disorder: Secondary | ICD-10-CM | POA: Diagnosis not present

## 2023-12-26 MED ORDER — HYDROXYZINE PAMOATE 25 MG PO CAPS
25.0000 mg | ORAL_CAPSULE | Freq: Every day | ORAL | 1 refills | Status: AC
  Filled 2023-12-26: qty 60, 30d supply, fill #0

## 2023-12-26 MED ORDER — PROPRANOLOL HCL 10 MG PO TABS
10.0000 mg | ORAL_TABLET | Freq: Two times a day (BID) | ORAL | 1 refills | Status: AC | PRN
  Filled 2023-12-26 – 2024-01-10 (×2): qty 60, 15d supply, fill #0

## 2023-12-26 MED ORDER — DULOXETINE HCL 30 MG PO CPEP
30.0000 mg | ORAL_CAPSULE | Freq: Every day | ORAL | 2 refills | Status: AC
  Filled 2023-12-26 – 2024-01-10 (×2): qty 30, 30d supply, fill #0

## 2023-12-28 ENCOUNTER — Telehealth: Payer: Self-pay

## 2023-12-28 NOTE — Telephone Encounter (Signed)
 Called patient to let her know that her orthotics are in. Patient will call back when she is able to schedule the appointment.

## 2024-01-04 NOTE — Progress Notes (Unsigned)
 GI Office Note    Referring Provider: Shona Norleen PEDLAR, MD Primary Care Physician:  Shona Norleen PEDLAR, MD Primary Gastroenterologist: Toribio Rubins, MD  Date:  01/04/2024  ID:  Kathleen Price, DOB 1971/01/07, MRN [id number]   Chief Complaint   No chief complaint on file.  History of Present Illness  Kathleen Price is a 53 y.o. female with a history of *** presenting today with complaint of    Last office visit ***.    Today:     Wt Readings from Last 6 Encounters:  12/22/23 145 lb 1 oz (65.8 kg)  12/15/23 145 lb (65.8 kg)  09/16/23 162 lb (73.5 kg)  11/11/22 162 lb 14.4 oz (73.9 kg)  04/01/20 155 lb (70.3 kg)  02/27/20 156 lb (70.8 kg)    There is no height or weight on file to calculate BMI.   Current Outpatient Medications  Medication Sig Dispense Refill   DULoxetine  (CYMBALTA ) 30 MG capsule Take 1 capsule (30 mg total) by mouth at bedtime. 30 capsule 1   DULoxetine  (CYMBALTA ) 30 MG capsule Take 1 capsule (30 mg total) by mouth at bedtime. 30 capsule 2   hydrOXYzine  (VISTARIL ) 25 MG capsule Take 1-2 capsules (25-50 mg total) by mouth at bedtime. 60 capsule 1   hydrOXYzine  (VISTARIL ) 25 MG capsule Take 1-2 capsules (25-50 mg total) by mouth at bedtime. 60 capsule 1   OVER THE COUNTER MEDICATION Magnesium and vitamin d3     Polyethyl Glycol-Propyl Glycol (SYSTANE) 0.4-0.3 % SOLN Place 1 drop into both eyes in the morning, at noon, and at bedtime.     propranolol  (INDERAL ) 10 MG tablet Take 1-2 tablets (10-20 mg total) by mouth up to 2 (two) times daily as needed for anxiety. 60 tablet 1   terbinafine  (LAMISIL ) 250 MG tablet Take 1 tablet (250 mg total) by mouth daily. 90 tablet 0   No current facility-administered medications for this visit.    Past Medical History:  Diagnosis Date   Anxiety    Headache(784.0)    IBS (irritable bowel syndrome)     Past Surgical History:  Procedure Laterality Date   ABLATION     AGILE CAPSULE N/A 07/26/2016    Procedure: AGILE CAPSULE;  Surgeon: Golda Claudis PENNER, MD;  Location: [facility];  Service: Endoscopy;  Laterality: N/A;   CESAREAN SECTION     CHOLECYSTECTOMY     COLONOSCOPY WITH PROPOFOL  N/A 04/01/2020   Procedure: COLONOSCOPY WITH PROPOFOL ;  Surgeon: Eartha Angelia Toribio, MD;  Location: [facility];  Service: Gastroenterology;  Laterality: N/A;  am   GIVENS CAPSULE STUDY N/A 08/02/2016   Procedure: GIVENS CAPSULE STUDY;  Surgeon: Golda Claudis PENNER, MD;  Location: [facility];  Service: Endoscopy;  Laterality: N/A;   POLYPECTOMY  04/01/2020   Procedure: POLYPECTOMY;  Surgeon: Eartha Angelia, Toribio, MD;  Location: [facility];  Service: Gastroenterology;;   TUBAL LIGATION      Family History  Problem Relation Age of Onset   Ovarian cancer Mother    Hypertension Father    Dementia Father     Allergies as of 01/05/2024 - Review Complete 12/23/2023  Allergen Reaction Noted   Morphine  and codeine Other (See Comments) 08/02/2016   Sulfa antibiotics Nausea Only 10/19/2018   Tuberulin old human [old tuberculin]  06/08/2016   Wellbutrin [bupropion hcl] Hives 02/28/2011    Social History   Socioeconomic History   Marital status: Married    Spouse name: Not on file  Number of children: Not on file   Years of education: Not on file   Highest education level: Not on file  Occupational History   Not on file  Tobacco Use   Smoking status: Every Day    Current packs/day: 0.50    Average packs/day: 0.5 packs/day for 25.0 years (12.5 ttl pk-yrs)    Types: Cigarettes    Passive exposure: Current   Smokeless tobacco: Never  Vaping Use   Vaping status: Never Used  Substance and Sexual Activity   Alcohol  use: No   Drug use: No   Sexual activity: Yes    Birth control/protection: Surgical  Other Topics Concern   Not on file  Social History Narrative   Not on file   Social Drivers of Health   Tobacco Use: High Risk (12/22/2023)   Patient History    Smoking Tobacco  Use: Every Day    Smokeless Tobacco Use: Never    Passive Exposure: Current  Financial Resource Strain: Not on file  Food Insecurity: No Food Insecurity (12/23/2023)   Epic    Worried About Programme Researcher, Broadcasting/film/video in the Last Year: Never true    Ran Out of Food in the Last Year: Never true  Transportation Needs: No Transportation Needs (12/23/2023)   Epic    Lack of Transportation (Medical): No    Lack of Transportation (Non-Medical): No  Physical Activity: Not on file  Stress: Not on file  Social Connections: Not on file  Depression (EYV7-0): Not on file  Alcohol  Screen: Not on file  Housing: Low Risk (12/23/2023)   Epic    Unable to Pay for Housing in the Last Year: No    Number of Times Moved in the Last Year: 1    Homeless in the Last Year: No  Utilities: Not At Risk (12/23/2023)   Epic    Threatened with loss of utilities: No  Health Literacy: Not on file    Review of Systems   Gen: Denies fever, chills, anorexia. Denies fatigue, weakness, weight loss.  CV: Denies chest pain, palpitations, syncope, peripheral edema, and claudication. Resp: Denies dyspnea at rest, cough, wheezing, coughing up blood, and pleurisy. GI: See HPI Derm: Denies rash, itching, dry skin Psych: Denies depression, anxiety, memory loss, confusion. No homicidal or suicidal ideation.  Heme: Denies bruising, bleeding, and enlarged lymph nodes.  Physical Exam   There were no vitals taken for this visit.  General:   Alert and oriented. No distress noted. Pleasant and cooperative.  Head:  Normocephalic and atraumatic. Eyes:  Conjuctiva clear without scleral icterus. Mouth:  Oral mucosa pink and moist. Good dentition. No lesions. Lungs:  Clear to auscultation bilaterally. No wheezes, rales, or rhonchi. No distress.  Heart:  S1, S2 present without murmurs appreciated.  Abdomen:  +BS, soft, non-tender and non-distended. No rebound or guarding. No HSM or masses noted. Rectal: *** Msk:  Symmetrical without  gross deformities. Normal posture. Extremities:  Without edema. Neurologic:  Alert and  oriented x4 Psych:  Alert and cooperative. Normal mood and affect.  Assessment & Plan  Kathleen Price is a 53 y.o. female presenting today with ***   Follow up   ***Follow up ***    Charmaine Melia, MSN, FNP-BC, AGACNP-BC [facility]

## 2024-01-04 NOTE — H&P (View-Only) (Signed)
 "  GI Office Note    Referring Provider: Shona Norleen PEDLAR, MD Primary Care Physician:  Shona Norleen PEDLAR, MD Primary Gastroenterologist: Toribio Rubins, MD (now Dr. Cinderella)  Date:  01/05/2024  ID:  Kathleen Price, DOB [birth year], MRN [id number]   Chief Complaint   Chief Complaint  Patient presents with   Abdominal Pain    Follow up from ED visit. Had stomach pains and blood in stools.    History of Present Illness  Kathleen Price is a 53 y.o. female with a history of IBS, anxiety, colon polyps, and hemorrhoids with rectal bleeding presenting today for follow-up of colitis and discuss lingering looser stools, abdominal pain, nausea.  Decreased appetite has improved since treatment.  Colonoscopy: 04/01/2020 - Hemorrhoids found on perianal exam.  - Three 4 to 6 mm polyps in the transverse colon, removed with a cold snare. - One 2 mm polyp in the transverse colon, removed with a cold biopsy forceps.  - One 2 mm polyp in the sigmoid colon, removed with a cold snare. - Non- bleeding external and internal hemorrhoids. - Path: 3 tubular adenomas and 2 benign polypoid tissues - Repeat in 5 years  Last office visit 11/11/22.  Had been taking meloxicam  for her feet pain and after finishing a 2-week course she had recurrent episodes of rectal bleeding and stated that every time she had a bowel movement she saw fresh blood.  Had not tried any topical measures.  Moving her bowels 1-2 times a day and not taking Linzess , doing well since starting supplementation for menopause.  Bowel movements has significantly improved.  Advised start hydrocortisone  suppositories twice daily for 7 days and if persistent bleeding then schedule hemorrhoid banding.  CT A/P with contrast 12/15/23 IMPRESSION: - No evidence of appendicitis or other acute findings. 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 Suspected small anterior uterine fibroid. - No inflammatory process within the stomach or bowel.   - Evidence of cholecystectomy  Recent hospital admission at [facility] and seen by myself on 12/5.  She presented to the ED with 24 hours of abdominal cramping and bloody diarrhea.  She had a CT of the abdomen pelvis with mild dilation of intrahepatic extrahepatic bile ducts likely consistent with cholecystectomy state, partially distended bladder, decompressed stomach, moderate wall thickening of the descending colon with pericolonic inflammation which was consistent with inflammatory or infectious colitis.  She was treated with Cipro  and Flagyl . Continue to recommend supportive measures and continue antibiotics and follow-up and stool studies.  Gave her Anusol  twice daily for hemorrhoids.  Stool studies were ordered but not collected.  CT A/P with contrast 12/22/2023- mild dilation of intrahepatic ans extrahepatic bile ducts liekly c/w cholecystectomy state, partially distended bladder, stomach decompressed, moderate wall thickening of descending colon with pericolonic inflammation. Impression: c/w infectious or inflammatory colitis.    Today:  Discussed the use of AI scribe software for clinical note transcription with the patient, who gave verbal consent to proceed.  She has been experiencing gastrointestinal symptoms for at least three months, including diarrhea that has transitioned to softer stools without blood, lack of appetite, bouts of nausea, and stomach pain. A week prior to this visit, she was hospitalized due to severe pain and inflammation, which she believes marked the onset of her current symptoms. She completed a six-day course of antibiotics last week, which alleviated some symptoms, but she continues to experience intermittent side pain described as a 'stitch' that comes and goes. No side effects from  the antibiotics have been noted.  She has a history of multiple polyps and colitis, with a similar episode approximately ten years ago that resolved after antibiotic treatment. Her current  medications include witch hazel for external hemorrhoid irritation, which has been exacerbated by frequent wiping due to diarrhea. She has not used Anusol  or hemorrhoid cream. Additionally, she has not been taking propanolol for anxiety, as she has not picked it up from the pharmacy.  In her social history, she mentions significant stress due to personal circumstances, including the loss of her mother in July, which she believes may have contributed to her symptoms. She recalls a period of high stress ten years ago when she was working two full-time jobs, which coincided with a similar episode of gastrointestinal distress.  She reports intermittent side follow-up recent changes in medication use.      Wt Readings from Last 6 Encounters:  01/05/24 150 lb 12.8 oz (68.4 kg)  12/22/23 145 lb 1 oz (65.8 kg)  12/15/23 145 lb (65.8 kg)  09/16/23 162 lb (73.5 kg)  11/11/22 162 lb 14.4 oz (73.9 kg)  04/01/20 155 lb (70.3 kg)    Body mass index is 25.88 kg/m.   Current Outpatient Medications  Medication Sig Dispense Refill   DULoxetine  (CYMBALTA ) 30 MG capsule Take 1 capsule (30 mg total) by mouth at bedtime. 30 capsule 1   hydrOXYzine  (VISTARIL ) 25 MG capsule Take 1-2 capsules (25-50 mg total) by mouth at bedtime. 60 capsule 1   OVER THE COUNTER MEDICATION Magnesium and vitamin d3     Polyethyl Glycol-Propyl Glycol (SYSTANE) 0.4-0.3 % SOLN Place 1 drop into both eyes in the morning, at noon, and at bedtime.     propranolol  (INDERAL ) 10 MG tablet Take 1-2 tablets (10-20 mg total) by mouth up to 2 (two) times daily as needed for anxiety. 60 tablet 1   terbinafine  (LAMISIL ) 250 MG tablet Take 1 tablet (250 mg total) by mouth daily. 90 tablet 0   DULoxetine  (CYMBALTA ) 30 MG capsule Take 1 capsule (30 mg total) by mouth at bedtime. 30 capsule 2   hydrOXYzine  (VISTARIL ) 25 MG capsule Take 1-2 capsules (25-50 mg total) by mouth at bedtime. 60 capsule 1   No current facility-administered medications  for this visit.    Past Medical History:  Diagnosis Date   Anxiety    Headache(784.0)    IBS (irritable bowel syndrome)     Past Surgical History:  Procedure Laterality Date   ABLATION     AGILE CAPSULE N/A 07/26/2016   Procedure: AGILE CAPSULE;  Surgeon: Golda Claudis PENNER, MD;  [facility];  Service: Endoscopy;  Laterality: N/A;   CESAREAN SECTION     CHOLECYSTECTOMY     COLONOSCOPY WITH PROPOFOL  N/A 04/01/2020   Procedure: COLONOSCOPY WITH PROPOFOL ;  Surgeon: Eartha Angelia Sieving, MD;  Location: [facility];  Service: Gastroenterology;  Laterality: N/A;  am   GIVENS CAPSULE STUDY N/A 08/02/2016   Procedure: GIVENS CAPSULE STUDY;  Surgeon: Golda Claudis PENNER, MD;  [facility]: [facility];  Service: Endoscopy;  Laterality: N/A;   POLYPECTOMY  04/01/2020   Procedure: POLYPECTOMY;  Surgeon: Eartha Angelia Sieving, MD;  Location: [facility];  Service: Gastroenterology;;   TUBAL LIGATION      Family History  Problem Relation Age of Onset   Ovarian cancer Mother    Hypertension Father    Dementia Father     Allergies as of 01/05/2024 - Review Complete 01/05/2024  Allergen Reaction Noted  Morphine  and codeine Other (See Comments) 08/02/2016   Sulfa antibiotics Nausea Only 10/19/2018   Tuberulin old human [old tuberculin]  06/08/2016   Wellbutrin [bupropion hcl] Hives 02/28/2011    Social History   Socioeconomic History   Marital status: Married    Spouse name: Not on file   Number of children: Not on file   Years of education: Not on file   Highest education level: Not on file  Occupational History   Not on file  Tobacco Use   Smoking status: Every Day    Current packs/day: 0.50    Average packs/day: 0.5 packs/day for 25.0 years (12.5 ttl pk-yrs)    Types: Cigarettes    Passive exposure: Current   Smokeless tobacco: Never  Vaping Use   Vaping status: Never Used  Substance and Sexual Activity   Alcohol  use: No   Drug use: No   Sexual activity:  Yes    Birth control/protection: Surgical  Other Topics Concern   Not on file  Social History Narrative   Not on file   Social Drivers of Health   Tobacco Use: High Risk (01/05/2024)   Patient History    Smoking Tobacco Use: Every Day    Smokeless Tobacco Use: Never    Passive Exposure: Current  Financial Resource Strain: Not on file  Food Insecurity: No Food Insecurity (12/23/2023)   Epic    Worried About Programme Researcher, Broadcasting/film/video in the Last Year: Never true    Ran Out of Food in the Last Year: Never true  Transportation Needs: No Transportation Needs (12/23/2023)   Epic    Lack of Transportation (Medical): No    Lack of Transportation (Non-Medical): No  Physical Activity: Not on file  Stress: Not on file  Social Connections: Not on file  Depression (EYV7-0): Not on file  Alcohol  Screen: Not on file  Housing: Low Risk (12/23/2023)   Epic    Unable to Pay for Housing in the Last Year: No    Number of Times Moved in the Last Year: 1    Homeless in the Last Year: No  Utilities: Not At Risk (12/23/2023)   Epic    Threatened with loss of utilities: No  Health Literacy: Not on file    Review of Systems   Gen: + weight loss. Denies fever, chills, anorexia. Denies fatigue, weakness.  CV: Denies chest pain, palpitations, syncope, peripheral edema, and claudication. Resp: Denies dyspnea at rest, cough, wheezing, coughing up blood, and pleurisy. GI: See HPI Derm: Denies rash, itching, dry skin Psych: + anxiety, stress. Denies depression, memory loss, confusion. No homicidal or suicidal ideation.  Heme: Denies bruising, bleeding, and enlarged lymph nodes.  Physical Exam   BP 121/80 (BP Location: Right Arm, Patient Position: Sitting, Cuff Size: Normal)   Pulse 79   Temp 97.7 F (36.5 C) (Temporal)   Ht 5' 4 (1.626 m)   Wt 150 lb 12.8 oz (68.4 kg)   BMI 25.88 kg/m   General:   Alert and oriented. No distress noted. Pleasant and cooperative.  Head:  Normocephalic and  atraumatic. Eyes:  Conjuctiva clear without scleral icterus. Mouth:  Oral mucosa pink and moist. Good dentition. No lesions. Lungs:  Clear to auscultation bilaterally. No wheezes, rales, or rhonchi. No distress.  Heart:  S1, S2 present without murmurs appreciated.  Abdomen:  +BS, soft, non-distended.  Mild TTP to LLQ and RLQ.  No rebound or guarding. No HSM or masses noted. Rectal: Deferred Msk:  Symmetrical without  gross deformities. Normal posture. Extremities:  Without edema. Neurologic:  Alert and  oriented x4 Psych:  Alert and cooperative. Normal mood and affect.  Assessment & Plan  Kathleen Price is a 53 y.o. female presenting today for hospital follow-up of colitis.    Colitis Recent episode with symptoms of diarrhea, abdominal pain, and nausea. Symptoms have improved with antibiotics, suggesting a bacterial cause. Differential includes C. difficile, E. coli, norovirus, and campylobacter, but unsure given stool samples were negative. Stress and grief may have contributed to immune suppression, increasing susceptibility to infection. Previous similar episode resolved with antibiotics. Imaging findings warrant further investigation to rule out underlying inflammatory processes. - Scheduled colonoscopy to evaluate for underlying inflammatory processes and potential etiology for colitis - Monitor symptoms and consider further investigation if rectal bleeding bleeding returns/worsens or stools do not normalize.  History of colon polyps Multiple colon polyps in the past. Given the history and recent symptoms, early colonoscopy is recommended to rule out recurrence or new polyps as well given the recent colitis and rectal bleeding - Scheduled colonoscopy to evaluate for recurrence of polyps and assess for underlying inflammatory processes.  Hemorrhoids Intermittent irritation and discomfort, exacerbated by diarrhea and frequent wiping. No significant bleeding reported. Witch hazel has been  used for relief. - Recommended use of Anusol  or Preparation H for symptomatic relief and to aid in healing.  -May continue witch hazel for external hemorrhoids.     Procedure:  Proceed with colonoscopy with propofol  by Dr. Cinderella in near future: the risks, benefits, and alternatives have been discussed with the patient in detail. The patient states understanding and desires to proceed. ASA 2  Follow up   Follow up post procedure.    Charmaine Melia, MSN, FNP-BC, AGACNP-BC [facility]  I have reviewed the note and agree with the APP's assessment as described in this progress note  Toribio Fortune, MD Gastroenterology and Hepatology [facility] "

## 2024-01-05 ENCOUNTER — Other Ambulatory Visit: Payer: Self-pay | Admitting: *Deleted

## 2024-01-05 ENCOUNTER — Encounter: Payer: Self-pay | Admitting: *Deleted

## 2024-01-05 ENCOUNTER — Encounter: Payer: Self-pay | Admitting: Gastroenterology

## 2024-01-05 ENCOUNTER — Ambulatory Visit: Admitting: Gastroenterology

## 2024-01-05 ENCOUNTER — Other Ambulatory Visit (HOSPITAL_COMMUNITY): Payer: Self-pay

## 2024-01-05 VITALS — BP 121/80 | HR 79 | Temp 97.7°F | Ht 64.0 in | Wt 150.8 lb

## 2024-01-05 DIAGNOSIS — K648 Other hemorrhoids: Secondary | ICD-10-CM

## 2024-01-05 DIAGNOSIS — K529 Noninfective gastroenteritis and colitis, unspecified: Secondary | ICD-10-CM

## 2024-01-05 DIAGNOSIS — K644 Residual hemorrhoidal skin tags: Secondary | ICD-10-CM

## 2024-01-05 DIAGNOSIS — K649 Unspecified hemorrhoids: Secondary | ICD-10-CM | POA: Diagnosis not present

## 2024-01-05 DIAGNOSIS — Z860100 Personal history of colon polyps, unspecified: Secondary | ICD-10-CM

## 2024-01-05 MED ORDER — NA SULFATE-K SULFATE-MG SULF 17.5-3.13-1.6 GM/177ML PO SOLN
ORAL | 0 refills | Status: AC
  Filled 2024-01-05: qty 354, 1d supply, fill #0

## 2024-01-05 NOTE — Patient Instructions (Addendum)
 Continue diet that you are comfortable with.   Monitor for recurrent bleeding and any return of abdominal pain or diarrhea. If diarrhea starts back up please let me know as I would like to do some stool studies at that time.  Given your recent colitis and history of colon polyps and some lingering symptoms even prior to your hospitalization we are going to complete your colonoscopy sooner than the 5-year timeframe.  If you feel your internal hemorrhoids are bothering you more often than not, hemorrhoid banding still remains an option for you but we can also revisit this after your colonoscopy.  Continue to use your Tucks pads/witch hazel and any over-the-counter creams as needed.  Follow-up post procedure.  It was a pleasure to see you today. I want to create trusting relationships with patients. If you receive a survey regarding your visit,  I greatly appreciate you taking time to fill this out on paper or through your MyChart. I value your feedback.  Charmaine Melia, MSN, FNP-BC, AGACNP-BC [facility]

## 2024-01-10 ENCOUNTER — Other Ambulatory Visit (HOSPITAL_COMMUNITY): Payer: Self-pay

## 2024-01-10 ENCOUNTER — Encounter (HOSPITAL_COMMUNITY)
Admission: RE | Admit: 2024-01-10 | Discharge: 2024-01-10 | Disposition: A | Discharge status: Home / Self Care | Source: Ambulatory Visit | Attending: Gastroenterology | Admitting: Gastroenterology

## 2024-01-10 NOTE — Pre-Procedure Instructions (Signed)
 Attempted pre-op phone call. I attempted the cell number, which states call cannot be completed at this time. I could not leave a message. I also attempted work number which rang with no answer.

## 2024-01-13 NOTE — Pre-Procedure Instructions (Signed)
 Attempted pre-op phone call. Message states cannot complete call at this time. Could not leave a message.

## 2024-01-16 ENCOUNTER — Encounter (HOSPITAL_COMMUNITY): Payer: Self-pay

## 2024-01-16 ENCOUNTER — Other Ambulatory Visit: Payer: Self-pay

## 2024-01-17 ENCOUNTER — Ambulatory Visit (HOSPITAL_COMMUNITY): Admitting: Certified Registered"

## 2024-01-17 ENCOUNTER — Encounter (HOSPITAL_COMMUNITY): Payer: Self-pay | Admitting: Gastroenterology

## 2024-01-17 ENCOUNTER — Ambulatory Visit (HOSPITAL_COMMUNITY)
Admission: RE | Admit: 2024-01-17 | Discharge: 2024-01-17 | Disposition: A | Discharge status: Home / Self Care | Attending: Gastroenterology | Admitting: Gastroenterology

## 2024-01-17 ENCOUNTER — Encounter (HOSPITAL_COMMUNITY)
Admission: RE | Disposition: A | Discharge status: Home / Self Care | Payer: Self-pay | Source: Home / Self Care | Attending: Gastroenterology

## 2024-01-17 DIAGNOSIS — K644 Residual hemorrhoidal skin tags: Secondary | ICD-10-CM | POA: Diagnosis not present

## 2024-01-17 DIAGNOSIS — R634 Abnormal weight loss: Secondary | ICD-10-CM

## 2024-01-17 DIAGNOSIS — K648 Other hemorrhoids: Secondary | ICD-10-CM

## 2024-01-17 DIAGNOSIS — F17210 Nicotine dependence, cigarettes, uncomplicated: Secondary | ICD-10-CM | POA: Diagnosis not present

## 2024-01-17 DIAGNOSIS — R933 Abnormal findings on diagnostic imaging of other parts of digestive tract: Secondary | ICD-10-CM | POA: Insufficient documentation

## 2024-01-17 DIAGNOSIS — K589 Irritable bowel syndrome without diarrhea: Secondary | ICD-10-CM | POA: Diagnosis not present

## 2024-01-17 DIAGNOSIS — K529 Noninfective gastroenteritis and colitis, unspecified: Secondary | ICD-10-CM

## 2024-01-17 DIAGNOSIS — Z860100 Personal history of colon polyps, unspecified: Secondary | ICD-10-CM | POA: Insufficient documentation

## 2024-01-17 DIAGNOSIS — F419 Anxiety disorder, unspecified: Secondary | ICD-10-CM | POA: Insufficient documentation

## 2024-01-17 DIAGNOSIS — K921 Melena: Secondary | ICD-10-CM | POA: Diagnosis present

## 2024-01-17 DIAGNOSIS — K640 First degree hemorrhoids: Secondary | ICD-10-CM | POA: Diagnosis present

## 2024-01-17 HISTORY — PX: COLONOSCOPY: SHX5424

## 2024-01-17 LAB — HM COLONOSCOPY

## 2024-01-17 SURGERY — COLONOSCOPY
Anesthesia: General

## 2024-01-17 MED ORDER — LIDOCAINE 2% (20 MG/ML) 5 ML SYRINGE
INTRAMUSCULAR | Status: DC | PRN
  Administered 2024-01-17: 60 mg via INTRAVENOUS

## 2024-01-17 MED ORDER — LACTATED RINGERS IV SOLN: INTRAVENOUS | Status: DC

## 2024-01-17 MED ORDER — PROPOFOL 500 MG/50ML IV EMUL
INTRAVENOUS | Status: DC | PRN
  Administered 2024-01-17: 150 ug/kg/min via INTRAVENOUS
  Administered 2024-01-17: 80 mg via INTRAVENOUS

## 2024-01-17 NOTE — Anesthesia Procedure Notes (Signed)
 Date/Time: 01/17/2024 11:29 AM  Performed by: Para Jerelene CROME, CRNAOxygen Delivery Method: Nasal cannula

## 2024-01-17 NOTE — Anesthesia Preprocedure Evaluation (Addendum)
"                                    Anesthesia Evaluation  Patient identified by MRN, date of birth, ID band Patient awake    Reviewed: Allergy & Precautions, H&P , NPO status , Patient's Chart, lab work & pertinent test results  Airway Mallampati: II  TM Distance: >3 FB Neck ROM: Full    Dental no notable dental hx.    Pulmonary Current Smoker   Pulmonary exam normal breath sounds clear to auscultation       Cardiovascular negative cardio ROS Normal cardiovascular exam Rhythm:Regular Rate:Normal     Neuro/Psych  Headaches  Anxiety      negative psych ROS   GI/Hepatic negative GI ROS, Neg liver ROS,,,  Endo/Other  negative endocrine ROS    Renal/GU negative Renal ROS  negative genitourinary   Musculoskeletal negative musculoskeletal ROS (+)    Abdominal   Peds negative pediatric ROS (+)  Hematology negative hematology ROS (+)   Anesthesia Other Findings   Reproductive/Obstetrics negative OB ROS                              Anesthesia Physical Anesthesia Plan  ASA: 2  Anesthesia Plan: General   Post-op Pain Management:    Induction: Intravenous  PONV Risk Score and Plan:   Airway Management Planned: Nasal Cannula  Additional Equipment:   Intra-op Plan:   Post-operative Plan:   Informed Consent: I have reviewed the patients History and Physical, chart, labs and discussed the procedure including the risks, benefits and alternatives for the proposed anesthesia with the patient or authorized representative who has indicated his/her understanding and acceptance.     Dental advisory given  Plan Discussed with: CRNA  Anesthesia Plan Comments:          Anesthesia Quick Evaluation  "

## 2024-01-17 NOTE — Anesthesia Postprocedure Evaluation (Signed)
"   Anesthesia Post Note  Patient: Kathleen Price  Procedure(s) Performed: COLONOSCOPY  Patient location during evaluation: PACU Anesthesia Type: General Level of consciousness: awake and alert Pain management: pain level controlled Vital Signs Assessment: post-procedure vital signs reviewed and stable Respiratory status: spontaneous breathing, nonlabored ventilation, respiratory function stable and patient connected to nasal cannula oxygen  Cardiovascular status: blood pressure returned to baseline and stable Postop Assessment: no apparent nausea or vomiting Anesthetic complications: no   No notable events documented.   Last Vitals:  Vitals:   01/17/24 0959 01/17/24 1151  BP: 116/81 92/68  Pulse:  91  Resp: 14 16  Temp: 36.6 C 36.6 C  SpO2: 99% 99%    Last Pain:  Vitals:   01/17/24 1151  TempSrc: Oral  PainSc: 0-No pain                 Andrea Limes      "

## 2024-01-17 NOTE — Interval H&P Note (Signed)
 History and Physical Interval Note:  01/17/2024 10:09 AM  Kathleen Price  has presented today for surgery, with the diagnosis of rectal bleeding,colitis fo decending colon,LLQ pain, weight loss.  The various methods of treatment have been discussed with the patient and family. After consideration of risks, benefits and other options for treatment, the patient has consented to  Procedures with comments: COLONOSCOPY (N/A) - 10:45 am, asa 2 as a surgical intervention.  The patient's history has been reviewed, patient examined, no change in status, stable for surgery.  I have reviewed the patient's chart and labs.  Questions were answered to the patient's satisfaction.     Kathleen Price

## 2024-01-17 NOTE — Op Note (Signed)
 [facility] Patient Name: Kathleen Price Procedure Date: 01/17/2024 11:20 AM MRN: [id number] Date of Birth: 1970/04/28 Attending MD: Deatrice Dine , MD, [id number] CSN: [id number] Age: 53 Admit Type: Outpatient Procedure:                Colonoscopy Indications:              Hematochezia, Abnormal CT of the GI tract,                            Incidental diarrhea noted Providers:                Deatrice Dine, MD, Madelin Hunter, RN, Chad Wilson,                            Technician Referring MD:              Medicines:                Monitored Anesthesia Care Complications:            No immediate complications. Estimated Blood Loss:     Estimated blood loss: none. Procedure:                Pre-Anesthesia Assessment:                           - Prior to the procedure, a History and Physical                            was performed, and patient medications and                            allergies were reviewed. The patient's tolerance of                            previous anesthesia was also reviewed. The risks                            and benefits of the procedure and the sedation                            options and risks were discussed with the patient.                            All questions were answered, and informed consent                            was obtained. Prior Anticoagulants: The patient has                            taken no anticoagulant or antiplatelet agents                            except for NSAID medication. ASA Grade Assessment:                            II -  A patient with mild systemic disease. After                            reviewing the risks and benefits, the patient was                            deemed in satisfactory condition to undergo the                            procedure.                           After obtaining informed consent, the colonoscope                            was passed under direct vision. Throughout the                             procedure, the patient's blood pressure, pulse, and                            oxygen  saturations were monitored continuously. The                            [id number]-[id number] ([id number]) Peds Colon was introduced                            through the anus and advanced to the the cecum,                            identified by appendiceal orifice and ileocecal                            valve. The colonoscopy was performed without                            difficulty. The patient tolerated the procedure                            well. The quality of the bowel preparation was                            evaluated using the BBPS [facility] Bowel Preparation                            Scale) with scores of: Right Colon = 3, Transverse                            Colon = 3 and Left Colon = 3 (entire mucosa seen                            well with no residual staining, small fragments of  stool or opaque liquid). The total BBPS score                            equals 9. The terminal ileum, ileocecal valve,                            appendiceal orifice, and rectum were photographed. Scope In: 11:33:36 AM Scope Out: 11:46:48 AM Scope Withdrawal Time: 0 hours 8 minutes 26 seconds  Total Procedure Duration: 0 hours 13 minutes 12 seconds  Findings:      Hemorrhoids were found on perianal exam.      There is no endoscopic evidence of inflammation in the entire colon.       Biopsies were taken with a cold forceps for histology.      Non-bleeding external and internal hemorrhoids were found during       retroflexion. The hemorrhoids were medium-sized. Impression:               - Hemorrhoids found on perianal exam.                           - Non-bleeding external and internal hemorrhoids. Moderate Sedation:      Per Anesthesia Care Recommendation:           - Patient has a contact number available for                            emergencies. The signs and symptoms  of potential                            delayed complications were discussed with the                            patient. Return to normal activities tomorrow.                            Written discharge instructions were provided to the                            patient.                           - Resume previous diet.                           - Continue present medications.                           - Await pathology results.                           - Repeat colonoscopy in 10 years for screening                            purposes.                           - Return to GI clinic as previously  scheduled. Procedure Code(s):        --- Professional ---                           [phone], Colonoscopy, flexible; with biopsy, single                            or multiple Diagnosis Code(s):        --- Professional ---                           K64.8, Other hemorrhoids                           K92.1, Melena (includes Hematochezia)                           R93.3, Abnormal findings on diagnostic imaging of                            other parts of digestive tract CPT copyright 2022 American Medical Association. All rights reserved. The codes documented in this report are preliminary and upon coder review may  be revised to meet current compliance requirements. Deatrice Dine, MD Deatrice Dine, MD 01/17/2024 11:55:25 AM This report has been signed electronically. Number of Addenda: 0

## 2024-01-17 NOTE — Transfer of Care (Addendum)
 Immediate Anesthesia Transfer of Care Note  Patient: Kathleen Price  Procedure(s) Performed: COLONOSCOPY  Patient Location: Short Stay  Anesthesia Type:MAC  Level of Consciousness: drowsy and patient cooperative  Airway & Oxygen  Therapy: Patient Spontanous Breathing  Post-op Assessment: Report given to RN and Post -op Vital signs reviewed and stable  Post vital signs: Reviewed and stable  Last Vitals:  Vitals Value Taken Time  BP 92/68 01/17/24   1151  Temp 36.6 01/17/24   1151  Pulse 88 01/17/24   1151  Resp 16 01/17/24   1151  SpO2 99% 01/17/24   1151    Last Pain:  Vitals:   01/17/24 1128  PainSc: 0-No pain         Complications: No notable events documented.

## 2024-01-17 NOTE — Addendum Note (Signed)
 Addendum  created 01/17/24 1245 by Para Jerelene CROME, CRNA   Clinical Note Signed

## 2024-01-17 NOTE — Discharge Instructions (Signed)

## 2024-01-18 ENCOUNTER — Encounter (INDEPENDENT_AMBULATORY_CARE_PROVIDER_SITE_OTHER): Payer: Self-pay | Admitting: *Deleted

## 2024-01-19 LAB — SURGICAL PATHOLOGY

## 2024-01-20 ENCOUNTER — Ambulatory Visit (INDEPENDENT_AMBULATORY_CARE_PROVIDER_SITE_OTHER): Payer: Self-pay | Admitting: Gastroenterology

## 2024-01-23 ENCOUNTER — Encounter (HOSPITAL_COMMUNITY): Payer: Self-pay | Admitting: Gastroenterology

## 2024-01-23 NOTE — Progress Notes (Signed)
 10 yr TCS noted in recall Patient result letter mailed procedure note and pathology result faxed to PCP

## 2024-02-15 NOTE — Telephone Encounter (Signed)
 Orthotics are still in the [place] office. Tried to call and leave msg but voicemail is not set up. Will send a message through MyChart.

## 2024-04-10 ENCOUNTER — Ambulatory Visit: Admitting: Physician Assistant
